# Patient Record
Sex: Female | Born: 1975 | Race: White | Hispanic: No | Marital: Married | Smoking: Never smoker
Health system: Southern US, Community
[De-identification: ages and names within clinical notes are randomized; demographics above are authoritative.]

## PROBLEM LIST (undated history)

## (undated) ENCOUNTER — Inpatient Hospital Stay (HOSPITAL_COMMUNITY): Payer: Self-pay

## (undated) DIAGNOSIS — D759 Disease of blood and blood-forming organs, unspecified: Secondary | ICD-10-CM

## (undated) DIAGNOSIS — E7800 Pure hypercholesterolemia, unspecified: Secondary | ICD-10-CM

## (undated) DIAGNOSIS — D682 Hereditary deficiency of other clotting factors: Secondary | ICD-10-CM

## (undated) DIAGNOSIS — N719 Inflammatory disease of uterus, unspecified: Secondary | ICD-10-CM

## (undated) DIAGNOSIS — T7840XA Allergy, unspecified, initial encounter: Secondary | ICD-10-CM

## (undated) DIAGNOSIS — D689 Coagulation defect, unspecified: Secondary | ICD-10-CM

## (undated) DIAGNOSIS — E785 Hyperlipidemia, unspecified: Secondary | ICD-10-CM

## (undated) DIAGNOSIS — E538 Deficiency of other specified B group vitamins: Secondary | ICD-10-CM

## (undated) DIAGNOSIS — D509 Iron deficiency anemia, unspecified: Secondary | ICD-10-CM

## (undated) DIAGNOSIS — Z8249 Family history of ischemic heart disease and other diseases of the circulatory system: Secondary | ICD-10-CM

## (undated) DIAGNOSIS — K5190 Ulcerative colitis, unspecified, without complications: Secondary | ICD-10-CM

## (undated) DIAGNOSIS — E559 Vitamin D deficiency, unspecified: Secondary | ICD-10-CM

## (undated) DIAGNOSIS — G4700 Insomnia, unspecified: Secondary | ICD-10-CM

## (undated) DIAGNOSIS — Z800 Family history of malignant neoplasm of digestive organs: Secondary | ICD-10-CM

## (undated) DIAGNOSIS — R002 Palpitations: Secondary | ICD-10-CM

## (undated) DIAGNOSIS — K219 Gastro-esophageal reflux disease without esophagitis: Secondary | ICD-10-CM

## (undated) HISTORY — DX: Inflammatory disease of uterus, unspecified: N71.9

## (undated) HISTORY — DX: Family history of ischemic heart disease and other diseases of the circulatory system: Z82.49

## (undated) HISTORY — DX: Insomnia, unspecified: G47.00

## (undated) HISTORY — DX: Gastro-esophageal reflux disease without esophagitis: K21.9

## (undated) HISTORY — DX: Vitamin D deficiency, unspecified: E55.9

## (undated) HISTORY — DX: Hyperlipidemia, unspecified: E78.5

## (undated) HISTORY — DX: Coagulation defect, unspecified: D68.9

## (undated) HISTORY — PX: COLONOSCOPY: SHX174

## (undated) HISTORY — DX: Deficiency of other specified B group vitamins: E53.8

## (undated) HISTORY — DX: Ulcerative colitis, unspecified, without complications: K51.90

## (undated) HISTORY — DX: Iron deficiency anemia, unspecified: D50.9

## (undated) HISTORY — DX: Allergy, unspecified, initial encounter: T78.40XA

## (undated) HISTORY — DX: Palpitations: R00.2

## (undated) HISTORY — PX: POLYPECTOMY: SHX149

## (undated) HISTORY — DX: Hereditary deficiency of other clotting factors: D68.2

## (undated) HISTORY — DX: Family history of malignant neoplasm of digestive organs: Z80.0

---

## 2003-06-08 DIAGNOSIS — G4700 Insomnia, unspecified: Secondary | ICD-10-CM | POA: Insufficient documentation

## 2006-12-04 ENCOUNTER — Emergency Department (HOSPITAL_COMMUNITY): Admission: EM | Admit: 2006-12-04 | Discharge: 2006-12-04 | Payer: Self-pay | Admitting: Family Medicine

## 2007-02-12 ENCOUNTER — Other Ambulatory Visit: Admission: RE | Admit: 2007-02-12 | Discharge: 2007-02-12 | Payer: Self-pay | Admitting: Family Medicine

## 2007-03-21 ENCOUNTER — Ambulatory Visit: Payer: Self-pay | Admitting: Hematology & Oncology

## 2008-02-29 ENCOUNTER — Encounter: Admission: RE | Admit: 2008-02-29 | Discharge: 2008-02-29 | Payer: Self-pay | Admitting: Family Medicine

## 2009-06-08 ENCOUNTER — Encounter: Admission: RE | Admit: 2009-06-08 | Discharge: 2009-06-08 | Payer: Self-pay | Admitting: Family Medicine

## 2010-06-06 HISTORY — PX: OVARIAN CYST REMOVAL: SHX89

## 2010-07-06 LAB — SURGICAL PCR SCREEN: MRSA, PCR: NEGATIVE

## 2010-07-06 LAB — CBC
MCH: 31.7 pg (ref 26.0–34.0)
MCV: 97.2 fL (ref 78.0–100.0)
Platelets: 281 10*3/uL (ref 150–400)
RBC: 4.29 MIL/uL (ref 3.87–5.11)

## 2010-07-07 ENCOUNTER — Ambulatory Visit (HOSPITAL_COMMUNITY)
Admission: RE | Admit: 2010-07-07 | Discharge: 2010-07-07 | Disposition: A | Discharge status: Home / Self Care | Payer: 59 | Attending: Obstetrics and Gynecology | Admitting: Obstetrics and Gynecology

## 2010-07-07 DIAGNOSIS — N83209 Unspecified ovarian cyst, unspecified side: Secondary | ICD-10-CM | POA: Insufficient documentation

## 2010-07-07 DIAGNOSIS — Z01818 Encounter for other preprocedural examination: Secondary | ICD-10-CM | POA: Insufficient documentation

## 2010-07-13 ENCOUNTER — Inpatient Hospital Stay (HOSPITAL_COMMUNITY)
Admission: RE | Admit: 2010-07-13 | Discharge: 2010-07-16 | DRG: 742 | Disposition: A | Discharge status: Home / Self Care | Payer: 59 | Source: Ambulatory Visit | Attending: Obstetrics and Gynecology | Admitting: Obstetrics and Gynecology

## 2010-07-13 ENCOUNTER — Other Ambulatory Visit: Payer: Self-pay | Admitting: Obstetrics and Gynecology

## 2010-07-13 DIAGNOSIS — N7013 Chronic salpingitis and oophoritis: Principal | ICD-10-CM | POA: Diagnosis present

## 2010-07-13 DIAGNOSIS — N831 Corpus luteum cyst of ovary, unspecified side: Secondary | ICD-10-CM | POA: Diagnosis present

## 2010-07-13 DIAGNOSIS — D62 Acute posthemorrhagic anemia: Secondary | ICD-10-CM | POA: Diagnosis present

## 2010-07-13 DIAGNOSIS — N838 Other noninflammatory disorders of ovary, fallopian tube and broad ligament: Secondary | ICD-10-CM | POA: Diagnosis present

## 2010-07-13 DIAGNOSIS — Z5331 Laparoscopic surgical procedure converted to open procedure: Secondary | ICD-10-CM

## 2010-07-13 DIAGNOSIS — N83209 Unspecified ovarian cyst, unspecified side: Secondary | ICD-10-CM | POA: Diagnosis present

## 2010-07-13 DIAGNOSIS — IMO0002 Reserved for concepts with insufficient information to code with codable children: Secondary | ICD-10-CM | POA: Diagnosis present

## 2010-07-14 LAB — CBC
MCHC: 31.7 g/dL (ref 30.0–36.0)
Platelets: 272 10*3/uL (ref 150–400)
RDW: 13 % (ref 11.5–15.5)
WBC: 13.6 10*3/uL — ABNORMAL HIGH (ref 4.0–10.5)

## 2010-07-15 LAB — CBC
HCT: 31.4 % — ABNORMAL LOW (ref 36.0–46.0)
MCH: 31.3 pg (ref 26.0–34.0)
MCV: 98.4 fL (ref 78.0–100.0)
Platelets: 265 10*3/uL (ref 150–400)
RDW: 12.8 % (ref 11.5–15.5)

## 2010-07-19 NOTE — H&P (Addendum)
NAME:  Anna Berger, Anna Berger                 ACCOUNT NO.:  [id number]  MEDICAL RECORD NO.:  [id number]          PATIENT TYPE:  AMB  LOCATION:  SDC                           FACILITY:  [facility]  PHYSICIAN:  Everett Graff, M.D.   DATE OF BIRTH:  02/19/1976  DATE OF ADMISSION: DATE OF DISCHARGE:  07/16/2010                             HISTORY & PHYSICAL   HISTORY OF PRESENT ILLNESS:  Anna Berger is a 35 year old married white female, para 1-0-0-1 with a longstanding history of infertility presenting for laparoscopic right ovarian cystectomy with the possibility of a laparoscopic salpingectomy and chromopertubation because of a right ovarian cyst and a hydrosalpinx.  For the past 2-1/2 years, the patient has been trying to conceive and has been given several rounds of Clomid - the last being July 2011.  In October 2011, when the patient had her annual examination, she was found to have an enlargement in her right adnexa that was mildly tender.  A pelvic ultrasound done showed a uterus measuring 8.16 x 6.80 x 4.16 cm along with a simple-appearing right ovarian cyst measuring 6.19 x 4.26 x 3.66 without any observed increased color Doppler flow.  The patient also was found to have a left hydrosalpinx measuring 4.72 x 4.25 x 1.77 cm with a left ovary-appearing normal.  At that time, the patient reported that she had been having ongoing right-sided pelvic pain for many years and that several times monthly, she would experience cramping (rated at a 5/10 on a 10-point pain scale) for several days.  She denies any urinary tract symptoms, changes in her bowel movements, vaginitis symptoms, nausea, vomiting or diarrhea.  The patient does have positional dyspareunia.  The patient's menstrual flow will last for 5 days, however, she only needs to change her pad once per day.  The patient has menstrual cramps that she rates as a 6/10 on a 10-point pain scale. She does find relief, however, by taking ibuprofen.  A  followup pelvic ultrasound was performed December 2011, that showed a persistent left hydrosalpinx along with a persistent right ovarian cyst measuring 6.66 x 4.74 x 3.87 cm.  Given that, the patient desires to conceive and has a history of infertility along with the persistent nature of both her ovarian cyst and hydrosalpinx, she has consented to proceed with surgical intervention for these findings.  PAST MEDICAL HISTORY:  OBSTETRICAL HISTORY:  Gravida 1, para 1-0-0-1.  The patient experienced a cesarean section in 2007.  GYNECOLOGIC HISTORY:  Menarche 35 years old.  Last menstrual period June 21, 2010.  She does not use any contraception, has no history of sexually transmitted diseases, has had a history of abnormal Pap smears for which she had a colposcopy in 2009, but her Pap smears have returned normal since that time most recent being October 2011.  MEDICAL HISTORY:  Ulcerative colitis, anemia, Factor V Leiden deficiency, hypercholesterolemia and blood clots.  SURGICAL HISTORY:  Negative.  The patient denies any history of blood transfusions.  FAMILY HISTORY:  Colon cancer, cardiovascular disease, Crohn disease and hypertension.  HABITS:  She does not use tobacco, illicit drugs.  She does  occasionally consume alcohol.  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  CURRENT MEDICATIONS: 1. Sulfasalazine 1 g daily. 2. Turmeric daily. 3. Probiotics daily. 4. Lipitor 40 mg at bedtime. 5. Trazodone 50 mg at bedtime. 6. Calcium with vitamin D daily. 7. Multivitamin daily.  ALLERGIES:  The patient has no known drug allergies.  Denies any sensitivities to latex, soy, shellfish or peanuts.  REVIEW OF SYSTEMS:  The patient denies any chest pain, shortness of breath, headache, vision changes, nasal congestion, difficulty swallowing, nausea, vomiting, diarrhea, heartburn, myalgias, arthralgias, skin rashes and except as mentioned in history of  present illness, the patient's review of systems is otherwise negative.  PHYSICAL EXAMINATION:  VITAL SIGNS:  Blood pressure is 102/62, pulse is 70, respirations 16, temperature 96 degrees Fahrenheit orally, weight 123 pounds, height 5 feet, three and one quater inches tall.  Body mass index 21. GENERAL:  Neck is supple without masses.  There is no thyromegaly or cervical adenopathy. HEART:  Regular rate and rhythm. LUNGS:  Clear. BACK:  No CVA tenderness. ABDOMEN:  No tenderness, guarding, rebound or organomegaly.  There are no palpable masses. EXTREMITIES:  No clubbing, cyanosis or edema. PELVIC:  EG/BUS are within normal limits.  Vagina is rugous.  Cervix are nontender without lesions.  Uterus appears normal size, shape and consistency without tenderness.  Adnexa reveals no masses.  However, the patient does have mild tenderness in her right adnexa.  IMPRESSION: 1. Persistent ovarian cyst. 2. Hydrosalpinx.  DISPOSITION:  A discussion was held with the patient regarding the indications for her procedures along with their risks which include, but are not limited to reaction to anesthesia, damage to adjacent organs, infection and excessive bleeding.  The patient further understands that there maybe a possibility that her ovary may need to be removed.  The patient has consented to proceed with a laparoscopic ovarian cystectomy with the possibility of a salpingectomy and chromopertubation at [facility] on July 13, 2010, at 10:30 a.m.     Anna Berger, P.A.-C   ______________________________ Everett Graff, M.D.   EJP/MEDQ  D:  07/08/2010  T:  07/09/2010  Job:  [id number]  Electronically Signed by Aris Georgia. on 07/27/2010 11:13:50 AM Electronically Signed by Everett Graff M.D. on 09/02/2010 09:48:51 AM

## 2010-07-23 NOTE — Discharge Summary (Signed)
Anna Berger, EKDAHL                 ACCOUNT NO.:  [id number]  MEDICAL RECORD NO.:  [id number]           PATIENT TYPE:  I  LOCATION:  9304                          FACILITY:  [facility]  PHYSICIAN:  Everett Graff, M.D.   DATE OF BIRTH:  [birth year]  DATE OF ADMISSION:  07/13/2010 DATE OF DISCHARGE:  07/16/2010                              DISCHARGE SUMMARY   DISCHARGE DIAGNOSES: 1. Persistent right ovarian cyst. 2. Left hydrosalpinx. 3. Dense pelvic adhesions. 4. Infertility.  OPERATION:  On the date of admission, the patient underwent laparoscopy followed by laparotomy, extensive lysis of adhesions, right ovarian cystectomy, and chromopertubation (revealing patent tubes).  HISTORY OF PRESENT ILLNESS:  Anna Berger is a 35 year old married white female para 1-0-0-1 with a longstanding history of infertility presenting for laparoscopic right ovarian cystectomy with the possibility of a laparoscopic salpingectomy and chromopertubation because of the wide ovarian cyst and hydrosalpinx.  Please see patient's dictated history and physical examination for details.  PREOPERATIVE PHYSICAL EXAMINATION:  VITAL SIGNS:  Blood pressure 102/62, pulse 70, respirations 16, temperature 96 degrees Fahrenheit orally, weight 123 pounds, height 5 feet 3-1/4 inches tall, body mass index was 21. GENERAL:  Within normal limits. PELVIC:  EG/BUS were within normal limits.  Vagina was rugose.  Cervix was nontender without lesions.  Uterus appeared normal in size, shape, and consistency without tenderness.  Adnexa did not reveal any masses, however, patient did have mild tenderness in her right adnexa.  HOSPITAL COURSE:  On the date of admission, the patient underwent aforementioned procedures tolerating them all well.  The patient's postoperative course was marked by lower abdominal incisional hematoma that was evacuated on postop day #2, and the wound packed with sterile plain gauze.  It was believed that this  was initiated by the institution of Lovenox postoperatively.  Once Lovenox was discontinued, the patient gradually had a slowing of her incisional hematoma formation and by hospital day #4, she was ready for discharge home.  The patient's discharge hemoglobin and hematocrit were 10.0 and 31.4 respectively with her preop hemoglobin having been 13.6 and hematocrit 41.7 respectively.  DISCHARGE MEDICATIONS:  The patient was directed to her home medication reconciliation form.  She was further prescribed: 1. Percocet 5/325 one to two tablets every 4-6 hours as needed for     pain. 2. Ibuprofen 600 mg with food every 6 hours for 3 days then as needed     for pain. 3. Colace 100 mg twice daily until her bowel movements are regular.  FOLLOWUP:  The patient is scheduled for followup visit with Dr. Mancel Bale on July 23, 2010, at 11:15 a.m.  She also has a postoperative visit scheduled for August 23, 2010, at 2 o'clock p.m.  DISCHARGE INSTRUCTIONS:  The patient was given a copy of [facility] postoperative instruction sheet.  She was further advised to avoid driving for 2 weeks, intercourse for 6 weeks, lifting for 6 weeks, that she may shower, she may walk up steps.  The patient's diet was without restriction.  WOUND CARE:  The patient was advised to keep her incision clean, dry, and utilize  warm compresses or heating pad over her clothing in the area of her lower abdominal incision four times daily.  FINAL PATHOLOGY:  Peritoneal washings, reactive mesothelial cells present.  The pathology regarding the patient's right ovarian cyst and hydrosalpinx were not available at the time of this dictation.     Elmira J. Florene Glen, P.A.-C   ______________________________ Everett Graff, M.D.    EJP/MEDQ  D:  07/19/2010  T:  07/20/2010  Job:  [id number]  Electronically Signed by Aris Georgia. on 07/21/2010 09:55:02 PM Electronically Signed by Everett Graff M.D. on 07/23/2010  09:32:32 AM

## 2010-09-09 NOTE — Op Note (Signed)
[name], Anna Berger                 ACCOUNT NO.:  [id number]  MEDICAL RECORD NO.:  [id number]           PATIENT TYPE:  LOCATION:                                 FACILITY:  PHYSICIAN:  Everett Graff, M.D.   DATE OF BIRTH:  1976/02/15  DATE OF PROCEDURE:  07/13/2010 DATE OF DISCHARGE:                              OPERATIVE REPORT   PREOPERATIVE DIAGNOSES: 1. Persistent ovarian cyst. 2. Hydrosalpinx.  POSTOPERATIVE DIAGNOSES: 1. Persistent ovarian cyst. 2. Hydrosalpinx. 3. Dense pelvic adhesions.  PROCEDURES: 1. Diagnostic laparoscopy. 2. Laparotomy with lysis of adhesions. 3. Bilateral paratubal cystectomy. 4. Chromopertubation.  ATTENDING:  Dr. Everett Graff.  ASSISTANT:  Elmira J. Florene Glen, PA-C  ANESTHESIA:  General.  SPECIMENS TO PATHOLOGY:  Bilateral paratubal versus ovarian cyst.  FLUIDS:  1600 mL.  URINE OUTPUT:  300 mL.  ESTIMATED BLOOD LOSS:  Left than 100 mL.  COMPLICATIONS:  None.  PROCEDURE IN DETAILS:  The patient was taken to the operating room after the risks, benefits and alternatives discussed with the patient.  The patient verbalized understanding and consent signed and witnessed.  The patient was placed under general anesthesia and prepped and draped in normal sterile fashion in the dorsal lithotomy position.  The intrauterine manipulator was placed using the acorn tenaculum in order to do chromopertubation as well.  This was done after placing a bivalve speculum in the patient's vagina and grasping the anterior lip of the cervix with single-tooth tenaculum.  Once the acorn tenaculum was placed, all instruments were removed from below and after gowning and re- gloving, attention was then turned to the abdomen where a 10-mm incision was made at the umbilicus.  The incision was taken down to the level of the peritoneum and the peritoneum entered and the Hasson placed after a pursestring stitch of 0 Vicryl was placed as well.  The laparoscope  was introduced and several dense adhesions were noted to the intraabdominal wall making difficult to perform laparoscopy.  The decision was made to perform laparotomy in order to finish the remainder of the procedure.  A Pfannenstiel skin incision was made at the site of prior C-section scar and carried down to the underlying layer of fascia with the scalpel and the Bovie.  This was done after tying the purse-string stitch that was placed on the fascia at the umbilicus and repairing the skin incision with 3-0 Monocryl via subcuticular stitch.  Kocher clamps were placed on the inferior aspect of the fascial incision and the rectus muscle excised from the fascia.  The same was done on the superior aspect of the fascial incision.  The muscle was separated in the midline and the peritoneum entered bluntly and extended manually and sharply with the Metzenbaum scissors.  The O'Connor-O'Sullivan was placed for self- retaining retraction and laparotomy sponges placed as well.  Bilateral paratubal versus ovarian cysts were noted.  The fallopian tubes bilaterally, however, appeared to be within normal limits.  All cysts were removed after being dissected away.  They were sent to pathology. Chromopertubation was performed and bilateral fallopian tube has been noted to be patent.  The ovaries  appeared to be within normal limits as well as the uterus.  Copious irrigation was performed and all instruments were removed.  The peritoneum was repaired with 2-0 chromic via running stitch.  The fascia was repaired with 0 Vicryl via running stitch.  The skin was reapproximated using 3-0 Monocryl via subcuticular stitch.  Sponge, lap and needle count was correct.  The patient tolerated the procedure well and was awaiting return to the recovery room in good condition.     Everett Graff, M.D.     AR/MEDQ  D:  09/08/2010  T:  09/09/2010  Job:  [id number]  Electronically Signed by Everett Graff M.D. on  09/09/2010 10:07:22 AM

## 2011-08-04 ENCOUNTER — Encounter: Payer: Self-pay | Admitting: Gastroenterology

## 2011-08-15 ENCOUNTER — Encounter: Payer: Self-pay | Admitting: *Deleted

## 2011-08-16 ENCOUNTER — Other Ambulatory Visit (INDEPENDENT_AMBULATORY_CARE_PROVIDER_SITE_OTHER): Payer: 59

## 2011-08-16 ENCOUNTER — Ambulatory Visit (INDEPENDENT_AMBULATORY_CARE_PROVIDER_SITE_OTHER): Payer: 59 | Admitting: Gastroenterology

## 2011-08-16 ENCOUNTER — Encounter: Payer: Self-pay | Admitting: Gastroenterology

## 2011-08-16 VITALS — BP 98/74 | HR 112 | Ht 63.0 in | Wt 115.4 lb

## 2011-08-16 DIAGNOSIS — N83209 Unspecified ovarian cyst, unspecified side: Secondary | ICD-10-CM

## 2011-08-16 DIAGNOSIS — K5190 Ulcerative colitis, unspecified, without complications: Secondary | ICD-10-CM | POA: Insufficient documentation

## 2011-08-16 DIAGNOSIS — N83202 Unspecified ovarian cyst, left side: Secondary | ICD-10-CM

## 2011-08-16 DIAGNOSIS — D6851 Activated protein C resistance: Secondary | ICD-10-CM | POA: Insufficient documentation

## 2011-08-16 DIAGNOSIS — Z800 Family history of malignant neoplasm of digestive organs: Secondary | ICD-10-CM

## 2011-08-16 DIAGNOSIS — D6859 Other primary thrombophilia: Secondary | ICD-10-CM

## 2011-08-16 DIAGNOSIS — M0760 Enteropathic arthropathies, unspecified site: Secondary | ICD-10-CM

## 2011-08-16 DIAGNOSIS — K51919 Ulcerative colitis, unspecified with unspecified complications: Secondary | ICD-10-CM | POA: Insufficient documentation

## 2011-08-16 DIAGNOSIS — E739 Lactose intolerance, unspecified: Secondary | ICD-10-CM

## 2011-08-16 DIAGNOSIS — IMO0002 Reserved for concepts with insufficient information to code with codable children: Secondary | ICD-10-CM | POA: Insufficient documentation

## 2011-08-16 DIAGNOSIS — D500 Iron deficiency anemia secondary to blood loss (chronic): Secondary | ICD-10-CM

## 2011-08-16 DIAGNOSIS — K639 Disease of intestine, unspecified: Secondary | ICD-10-CM | POA: Insufficient documentation

## 2011-08-16 DIAGNOSIS — N83201 Unspecified ovarian cyst, right side: Secondary | ICD-10-CM | POA: Insufficient documentation

## 2011-08-16 LAB — BASIC METABOLIC PANEL
BUN: 14 mg/dL (ref 6–23)
GFR: 66.46 mL/min (ref >60.00)
Glucose, Bld: 84 mg/dL (ref 70–99)
Sodium: 136 mEq/L (ref 135–145)

## 2011-08-16 LAB — CBC WITH DIFFERENTIAL/PLATELET
Basophils Relative: 1.1 % (ref 0.0–3.0)
Eosinophils Absolute: 0.6 10*3/uL (ref 0.0–0.7)
HCT: 40.4 % (ref 36.0–46.0)
Hemoglobin: 13.3 g/dL (ref 12.0–15.0)
Lymphocytes Relative: 16 % (ref 12.0–46.0)
Lymphs Abs: 1.5 10*3/uL (ref 0.7–4.0)
MCHC: 33.1 g/dL (ref 30.0–36.0)
Neutrophils Relative %: 70.3 % (ref 43.0–77.0)
Platelets: 348 10*3/uL (ref 150.0–400.0)
RBC: 4.12 Mil/uL (ref 3.87–5.11)

## 2011-08-16 LAB — HEPATIC FUNCTION PANEL
ALT: 16 U/L (ref 0–35)
AST: 25 U/L (ref 0–37)
Bilirubin, Direct: 0.1 mg/dL (ref 0.0–0.3)

## 2011-08-16 LAB — TSH: TSH: 1.52 u[IU]/mL (ref 0.35–5.50)

## 2011-08-16 LAB — FOLATE: Folate: 8.8 ng/mL (ref >5.9)

## 2011-08-16 LAB — VITAMIN B12: Vitamin B-12: 243 pg/mL (ref 211–911)

## 2011-08-16 LAB — SEDIMENTATION RATE: Sed Rate: 25 mm/hr — ABNORMAL HIGH (ref 0–22)

## 2011-08-16 LAB — HIGH SENSITIVITY CRP: CRP, High Sensitivity: 1 mg/L (ref 0.000–5.000)

## 2011-08-16 MED ORDER — CELECOXIB 200 MG PO CAPS: 200.0000 mg | ORAL_CAPSULE | Freq: Every day | ORAL | Status: DC

## 2011-08-16 MED ORDER — MESALAMINE 1.2 G PO TBEC: DELAYED_RELEASE_TABLET | ORAL | Status: DC

## 2011-08-16 NOTE — Patient Instructions (Signed)
Stop your Sulfasalazine.  Start Lialda two tablet by mouth every morning, Your prescription has been sent to you pharmacy.  Take Celebrex as needed once a day, Your prescription has been sent to you pharmacy.  Please go to the basement today for your labs.  Make a appointment to come back and see Dr Sharlett Iles in 2 months.  You were given a FODMAP diet to follow today.  Information given on the [employer].

## 2011-08-16 NOTE — Progress Notes (Signed)
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 Since age 36, this patient has carried a diagnosis of ulcerative colitis, and is submitted confirmed on numerous colonoscopies performed in [facility] with last procedure in June of 2012 colonoscopy showing diffuse mild erythema and granularity. Multiple biopsies showed no evidence of dysplasia. At that time she was not on aminosialyicate therapy, but subsequently  restarted Azulfidine 1 g a day. She currently will have 4-5 loose nonbloody bowel movements a day with some gas and bloating, occasional acid reflux symptoms controlled with when necessary H2 blockers, and she does have lactose intolerance. Patient has been on prednisone in the past, but has not been on immunosuppressants or biological agents. She relates that she currently feels well and has no systemic complaints except for arthralgias in her knees and other large joints. She does use fairly often ibuprofen.  She currently denies abdominal pain except for occasional right lower quadrant pain, no current rectal bleeding. Her appetite is good her weight is stable. Her family history is remarkable for colon cancer in uncle, also a relative with Crohn's disease.Murel had surgery for multiple ovarian cyst in the last 2-3 years, and apparently was diagnosed with factor V Leiden mutation, has been seen by oncology, and she will need Coumadin if she becomes pregnant. She currently is trying to become pregnant, but has had difficulty achieving such. She was on Depo for several years. The patient also describes periodic aphthous ulcerations in her mouth, no skin rashes, and no hepatobiliary symptoms with a previous negative gallbladder ultrasound. She has a chronic anemia, but cannot tolerate iron preparations. She has not been on recent antibiotics, and gives no history of C.  difficile infections. Patient does take probiotic therapy which apparently has helped her symptomatology subjectively.  I have reviewed this patient's present history, medical and surgical past history, allergies and medications.     ROS: The remainder of the 10 point ROS is negative  No Known Allergies Outpatient Prescriptions Prior to Visit  Medication Sig Dispense Refill  . atorvastatin (LIPITOR) 40 MG tablet Take 20 mg by mouth daily.       . cetirizine (ZYRTEC) 10 MG tablet Take 10 mg by mouth as needed.       . Misc Natural Products (TURMERIC CURCUMIN) CAPS Take 1 capsule by mouth at bedtime.      Marland Kitchen PROBIOTIC CAPS Take 1 capsule by mouth daily. 20 billion      . traZODone (DESYREL) 100 MG tablet Take 50-100 mg by mouth at bedtime.      . Calcium Carbonate-Vitamin D (CALCIUM + D) 600-200 MG-UNIT TABS Take 1 tablet by mouth 2 (two) times daily.      . Prenatal Vit-Fe Fumarate-FA (MULTIVITAMIN-PRENATAL) 27-0.8 MG TABS Take 1 tablet by mouth daily.       Past Medical History  Diagnosis Date  . UC (ulcerative colitis)   . Hyperlipemia   . Endometritis   . Iron deficiency anemia   . Factor V deficiency   . Unspecified vitamin D deficiency   . Insomnia, unspecified   . Family history of malignant neoplasm of gastrointestinal tract    Past Surgical History  Procedure Date  . Cesarean section 02/2006  . Ovarian cyst removal 2012   History   Social History  . Marital Status: Married    Spouse Name: N/A    Number of Children: 1  .  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 Smoking status: Never Smoker   . Smokeless tobacco: Never Used  . Alcohol Use: Yes     social  . Drug Use: No  . Sexually Active: None   Other Topics Concern  . None   Social History Narrative  . None   Family History  Problem Relation Age of Onset  . Hypertension Father   . Coronary artery disease Father   . Heart attack Father  58  . Hyperlipidemia Father   . Hyperlipidemia Brother   . Crohn's disease Maternal Uncle   . Colon cancer Maternal Uncle   . Colon polyps Paternal Aunt   . Diabetes Maternal Uncle         Physical Exam: General well developed well nourished patient in no acute distress, appearing her stated age Eyes PERRLA, no icterus, fundoscopic exam per opthamologist Skin no lesions noted Neck supple, no adenopathy, no thyroid enlargement, no tenderness Chest clear to percussion and auscultation Heart no significant murmurs, gallops or rubs noted Abdomen no hepatosplenomegaly masses or tenderness, BS normal. Minimal tenderness bilaterally in the lower quadrant areas. Extremities no acute joint lesions, edema, phlebitis or evidence of cellulitis. Neurologic patient oriented x 3, cranial nerves intact, no focal neurologic deficits noted. Psychological mental status normal and normal affect.  Assessment and plan: Chronic ulcerative colitis of over 20 years duration in a patient who is currently partial remission on low dose aminosialyicate therapy. She is due for repeat colonoscopy with dysplasia screening every 2 years. I ordered labs today including CRP and sed rate, have changed her from Azulfidine to Lialda 2.4 g a day with twice a day usage if needed,FODMAP diet avoidance of nonabsorbable dietary carbohydrates, have urged her use when necessary Lactaid tablets for lactose deficiency, she is to avoid Cox 1 anti-inflammatories and to use when necessary Celebrex 200 mg a day. We have given her information concerning the national [employer] as a source of reliable information concerning IBD its management, medications, etc. she is to see me back in 2 months time for followup or sooner if her disease worsens. I spoke with her today about pregnancy, it would be of use for her to be in remission before she becomes pregnant. Apparently, gynecologic evaluation is currently in process. The patient  does not abuse alcohol or cigarettes. She appears healthy and well-nourished.  Encounter Diagnosis  Name Primary?  . UC (ulcerative colitis) Yes

## 2011-08-17 ENCOUNTER — Other Ambulatory Visit: Payer: Self-pay | Admitting: Gastroenterology

## 2011-08-17 LAB — CELIAC PANEL 10
Endomysial Screen: NEGATIVE
IgA: 331 mg/dL (ref 69–380)
Tissue Transglutaminase Ab, IgA: 3.9 U/mL (ref <20)

## 2011-08-17 MED ORDER — CYANOCOBALAMIN 1000 MCG/ML IJ SOLN: INTRAMUSCULAR | Status: DC

## 2011-09-12 ENCOUNTER — Encounter (INDEPENDENT_AMBULATORY_CARE_PROVIDER_SITE_OTHER): Payer: 59

## 2011-09-12 ENCOUNTER — Encounter (INDEPENDENT_AMBULATORY_CARE_PROVIDER_SITE_OTHER): Payer: 59 | Admitting: Obstetrics and Gynecology

## 2011-09-12 DIAGNOSIS — N979 Female infertility, unspecified: Secondary | ICD-10-CM

## 2011-10-12 ENCOUNTER — Encounter: Payer: Self-pay | Admitting: *Deleted

## 2011-10-18 ENCOUNTER — Ambulatory Visit (INDEPENDENT_AMBULATORY_CARE_PROVIDER_SITE_OTHER): Payer: 59 | Admitting: Gastroenterology

## 2011-10-18 ENCOUNTER — Encounter: Payer: Self-pay | Admitting: Gastroenterology

## 2011-10-18 VITALS — BP 118/72 | HR 80 | Ht 63.0 in | Wt 117.0 lb

## 2011-10-18 DIAGNOSIS — K5190 Ulcerative colitis, unspecified, without complications: Secondary | ICD-10-CM

## 2011-10-18 NOTE — Progress Notes (Signed)
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 She is currently on every two-year dysplasia colonoscopy screening protocol, and is due for followup exam in June of next year. Lab data has shown evidence of B12 deficiency, and she currently is on parenteral B12 replacement therapy, and has noticed increased strength and sense of well-being. She denies diarrhea, rectal bleeding, abdominal pain, anorexia, weight loss, upper GI or hepatobiliary complaints. She only tolerate Lialda 1.2 g a day. Large doses of aminosalicylates have caused  severe nausea.  Current Medications, Allergies, Past Medical History, Past Surgical History, Family History and Social History were reviewed in [employer] record.  Pertinent Review of Systems Negative   Physical Exam: Healthy-appearing patient in no distress. Blood pressure 118/72, pulse 80 and regular, weight 117 pounds, and BMI 20.73. I cannot appreciate stigmata of chronic liver disease. Her abdomen shows no organomegaly, distention, masses or tenderness. Bowel sounds are normal.    Assessment and Plan: Chronic ulcerative colitis currently doing well on Lialda 1.2 g a day. She uses when necessary Celebrex for arthritis, monthly B12 injections, and probiotic therapy. We will schedule her for followup colonoscopy in dysplasia screening next June. Have not changed her medications otherwise, we'll see her on when necessary basis as needed. She has previously been on prednisone but not on immunosuppressants or biological agents. No diagnosis found.

## 2011-10-18 NOTE — Patient Instructions (Signed)
Continue Lialda one tablet by mouth once a day.

## 2011-11-16 ENCOUNTER — Telehealth: Payer: Self-pay

## 2011-11-16 NOTE — Telephone Encounter (Signed)
Pt states she never filled the script for Femara written on 09/12/2011 because she took it to another pharmacy and it was way too expensive. So she took a 2 cycle break. Just wanted me to let AR know that she never did 09/2011's cycle of Femara. I will call pt in am to set up Follicle study on day 10. Today is her day one. Carloyn Manner A

## 2011-11-16 NOTE — Telephone Encounter (Signed)
Called RF in of Femara 2.5 mg sig 2 tabs PO qd x 5 days, starting on day #3. # 10 tabs. ) RF at this time. To [facility], per AR. Carloyn Manner A

## 2011-11-17 ENCOUNTER — Other Ambulatory Visit: Payer: Self-pay

## 2011-11-17 ENCOUNTER — Telehealth: Payer: Self-pay

## 2011-11-17 DIAGNOSIS — N970 Female infertility associated with anovulation: Secondary | ICD-10-CM

## 2011-11-17 NOTE — Telephone Encounter (Signed)
Called pt to get her scheduled for follicle study on Day 10 per AR. It is ordered and sched for the 21st. June. If cyst is gone or stable and pt has dominant follicle, / do HCG. Notify Dr. On call. Carloyn Manner A

## 2011-11-25 ENCOUNTER — Other Ambulatory Visit: Payer: Self-pay | Admitting: Obstetrics and Gynecology

## 2011-11-25 ENCOUNTER — Other Ambulatory Visit: Payer: 59

## 2011-11-25 ENCOUNTER — Ambulatory Visit (INDEPENDENT_AMBULATORY_CARE_PROVIDER_SITE_OTHER): Payer: 59

## 2011-11-25 DIAGNOSIS — N970 Female infertility associated with anovulation: Secondary | ICD-10-CM

## 2011-11-25 MED ORDER — CHORIOGONADOTROPIN ALFA 250 MCG/0.5ML ~~LOC~~ INJ
250.0000 ug | INJECTION | Freq: Once | SUBCUTANEOUS | Status: AC
  Administered 2011-11-25: 0.25 mg via SUBCUTANEOUS

## 2011-11-25 NOTE — Progress Notes (Signed)
Pt presented for U/S.  Per Dr Brent General showed dominant follicle of 0.93JP  Previous ovarian cyst resolved.   To proceed with HCG.   Given without difficulty.  No reaction noted after 15 min.  Pt to have timed IC.   Will call with menses or +UPT.

## 2011-12-09 ENCOUNTER — Telehealth: Payer: Self-pay

## 2011-12-09 NOTE — Telephone Encounter (Signed)
Per AR. I called in another RF of Femara 2.5 sig take 2 daily days 3-7 #10 . Pt will be set up for follicle study on the 23FT at [facility].Denice Paradise CMA

## 2011-12-13 ENCOUNTER — Other Ambulatory Visit: Payer: Self-pay | Admitting: Obstetrics and Gynecology

## 2011-12-13 ENCOUNTER — Telehealth: Payer: Self-pay

## 2011-12-13 ENCOUNTER — Other Ambulatory Visit: Payer: Self-pay

## 2011-12-13 DIAGNOSIS — N979 Female infertility, unspecified: Secondary | ICD-10-CM

## 2011-12-13 NOTE — Telephone Encounter (Signed)
Called Anna Berger to let her know I sched her Follicle study for 09/40/76 @ [facility] ( Sat.) Order is in [place].  Audery Amel was notified to call [facility] on Call or Dr. On call that day to review for need for HCG.

## 2011-12-17 ENCOUNTER — Ambulatory Visit (HOSPITAL_COMMUNITY)
Admission: RE | Admit: 2011-12-17 | Discharge: 2011-12-17 | Disposition: A | Discharge status: Home / Self Care | Payer: 59 | Source: Ambulatory Visit | Attending: Obstetrics and Gynecology | Admitting: Obstetrics and Gynecology

## 2011-12-17 ENCOUNTER — Inpatient Hospital Stay (HOSPITAL_COMMUNITY)
Admission: AD | Admit: 2011-12-17 | Discharge: 2011-12-17 | Disposition: A | Discharge status: Home / Self Care | Payer: 59 | Source: Ambulatory Visit | Attending: Obstetrics and Gynecology | Admitting: Obstetrics and Gynecology

## 2011-12-17 DIAGNOSIS — N838 Other noninflammatory disorders of ovary, fallopian tube and broad ligament: Secondary | ICD-10-CM | POA: Insufficient documentation

## 2011-12-17 DIAGNOSIS — N979 Female infertility, unspecified: Secondary | ICD-10-CM | POA: Insufficient documentation

## 2011-12-17 NOTE — MAU Note (Deleted)
Administered 10,000 units in 2.5 ml injections x 4, 2 to left out quadrant, and 2 to right  Outer quadrant, patient brought med from [facility] as unable at [facility].

## 2011-12-17 NOTE — MAU Note (Signed)
Administered 10,000 units of HCG as ordered left outer quadrant.

## 2011-12-20 ENCOUNTER — Telehealth: Payer: Self-pay

## 2011-12-20 NOTE — Telephone Encounter (Signed)
Spoke to McCoy at [place] re: retro prior auth for Novadrel that pt had to pay out of pocket for on 12/17/2011 when she had the follicle study at [facility], and she was given the inj. After picking up med at [facility]. Freda Munro will process a back dated request for prior auth for the 07/13 date of service. And she was able to give future prior auth for this if needed in the future. The Auth # is [id number] and expires in one year. If the retro Josem Kaufmann is given, [facility] will need to reprocess the claim and figure out how to re-imburse the pt. Carloyn Manner A

## 2012-02-27 ENCOUNTER — Telehealth: Payer: Self-pay

## 2012-02-27 ENCOUNTER — Other Ambulatory Visit: Payer: Self-pay

## 2012-02-27 DIAGNOSIS — N970 Female infertility associated with anovulation: Secondary | ICD-10-CM

## 2012-02-27 NOTE — Telephone Encounter (Signed)
Spoke to pt to let her know that we'll refill her Femara 2.5 mg sig: take 2 tabs qd days 3-7 of cycle. #10  O RF's. To [facility]. Pt was sched for consult w/ Dr Mancel Bale on the 30th to discuss plan. Pt may have day 10 follicle study after consult if they decide to go that route. Dr. Mancel Bale was made aware of this. Carloyn Manner A

## 2012-02-27 NOTE — Telephone Encounter (Signed)
Spoke to pt who called stating that her and her husband had taken a little break in trying to conceive, but they are ready to start trying again. Wants to know what Dr. Mancel Bale wants her to do next. Today is day 3 so I told her I would get an answer for her asap and get back with her so that if she needs to start on meds today they will be available at her pharmacy. [facility]. Carloyn Manner A

## 2012-03-05 ENCOUNTER — Encounter: Payer: 59 | Admitting: Obstetrics and Gynecology

## 2012-03-05 ENCOUNTER — Other Ambulatory Visit: Payer: Self-pay | Admitting: Obstetrics and Gynecology

## 2012-03-05 DIAGNOSIS — N979 Female infertility, unspecified: Secondary | ICD-10-CM

## 2012-03-13 ENCOUNTER — Ambulatory Visit (INDEPENDENT_AMBULATORY_CARE_PROVIDER_SITE_OTHER): Payer: 59 | Admitting: Obstetrics and Gynecology

## 2012-03-13 ENCOUNTER — Encounter: Payer: Self-pay | Admitting: Obstetrics and Gynecology

## 2012-03-13 VITALS — BP 122/68 | Resp 16 | Ht 63.0 in | Wt 118.0 lb

## 2012-03-13 DIAGNOSIS — N970 Female infertility associated with anovulation: Secondary | ICD-10-CM

## 2012-03-13 MED ORDER — LETROZOLE 2.5 MG PO TABS: 5.0000 mg | ORAL_TABLET | Freq: Every day | ORAL | Status: DC

## 2012-03-13 NOTE — Progress Notes (Signed)
Here to discuss next step with infertility.  She and her husband are serious about proceeding.  Filed Vitals:   03/13/12 1613  BP: 122/68  Resp: 16   A/P Pt says she can no longer get any more Rxs for femara unless she pays out of pocket if it is not written through [facility] and she is given a 90 day supply.  She is a PA and understands instructions quite well so I will comply with this request. If however, she does not conceive after 3 cycles of Femara 50m with trigger injection of HCG or ovidrel and IUI, then [facility] I suggested that now but she says we have not done the IUI so she would like to try that first and she says she and her husband are serious. Rx sent Pt will call to have day 10 follicle study, with trigger inj once follicle is 2cm and then IUI. NIzora Galahas been sent this chart to make sure her husband understands his part in the IUI Pt knows to start the medicine on day 3 for 5days Questions answered

## 2012-03-14 ENCOUNTER — Telehealth: Payer: Self-pay | Admitting: Obstetrics and Gynecology

## 2012-03-14 NOTE — Telephone Encounter (Signed)
Message copied by Tania Ade on Wed Mar 14, 2012  4:46 PM ------      Message from: Everett Graff      Created: Tue Mar 13, 2012  5:48 PM       Please make sure this pt's husband gets info for sperm wash in preparation for IUI.  Thank you

## 2012-03-14 NOTE — Telephone Encounter (Signed)
Tc TO pt. Explained sperm washing and IUI procedures including fees.  Pt verbalizes comprehension.

## 2012-03-19 ENCOUNTER — Telehealth: Payer: Self-pay

## 2012-03-20 ENCOUNTER — Telehealth: Payer: Self-pay

## 2012-03-20 NOTE — Telephone Encounter (Signed)
LM for pt to cb about getting AEX scheduled after 05/12/2012. Anna Berger A

## 2012-03-23 ENCOUNTER — Other Ambulatory Visit: Payer: Self-pay

## 2012-03-23 DIAGNOSIS — N979 Female infertility, unspecified: Secondary | ICD-10-CM

## 2012-03-23 DIAGNOSIS — N970 Female infertility associated with anovulation: Secondary | ICD-10-CM

## 2012-03-26 ENCOUNTER — Ambulatory Visit (INDEPENDENT_AMBULATORY_CARE_PROVIDER_SITE_OTHER): Payer: 59

## 2012-03-26 ENCOUNTER — Telehealth: Payer: Self-pay

## 2012-03-26 DIAGNOSIS — N979 Female infertility, unspecified: Secondary | ICD-10-CM

## 2012-03-26 NOTE — Telephone Encounter (Signed)
Spoke to pt to let her know that Dr. Mancel Bale wants her to have repeat follicle study on Wed this week. Appt scheduled for 9 am the 23rd. Carloyn Manner A

## 2012-03-28 ENCOUNTER — Other Ambulatory Visit (INDEPENDENT_AMBULATORY_CARE_PROVIDER_SITE_OTHER): Payer: 59

## 2012-03-28 ENCOUNTER — Other Ambulatory Visit: Payer: Self-pay | Admitting: Radiology

## 2012-03-28 ENCOUNTER — Encounter: Payer: Self-pay | Admitting: Obstetrics and Gynecology

## 2012-03-28 ENCOUNTER — Encounter (INDEPENDENT_AMBULATORY_CARE_PROVIDER_SITE_OTHER): Payer: 59

## 2012-03-28 ENCOUNTER — Other Ambulatory Visit: Payer: Self-pay

## 2012-03-28 DIAGNOSIS — N970 Female infertility associated with anovulation: Secondary | ICD-10-CM

## 2012-03-28 DIAGNOSIS — N939 Abnormal uterine and vaginal bleeding, unspecified: Secondary | ICD-10-CM

## 2012-03-28 DIAGNOSIS — N979 Female infertility, unspecified: Secondary | ICD-10-CM

## 2012-03-28 MED ORDER — CHORIOGONADOTROPIN ALFA 250 MCG/0.5ML ~~LOC~~ INJ
250.0000 ug | INJECTION | Freq: Once | SUBCUTANEOUS | Status: AC
  Administered 2012-03-28: 0.25 mg via SUBCUTANEOUS

## 2012-03-28 NOTE — Progress Notes (Signed)
Per Dr Gillermo Murdoch, pt's husband scheduled for sperm washing 03/29/12 at 1:00 at [facility], [place], [place]. Sched for IUI 03/29/12 at 2"00 with Dr Gillermo Murdoch. Pt aware payment needed prior to IUI for sperm washing.

## 2012-03-28 NOTE — Progress Notes (Unsigned)
Ovidrel given per AR SQ without difficulty.  Pt waited 20 minutes after, no reaction.

## 2012-03-29 ENCOUNTER — Encounter: Payer: Self-pay | Admitting: Obstetrics and Gynecology

## 2012-03-29 ENCOUNTER — Ambulatory Visit (INDEPENDENT_AMBULATORY_CARE_PROVIDER_SITE_OTHER): Payer: 59 | Admitting: Obstetrics and Gynecology

## 2012-03-29 VITALS — BP 100/64 | Ht 63.0 in | Wt 117.0 lb

## 2012-03-29 DIAGNOSIS — Z139 Encounter for screening, unspecified: Secondary | ICD-10-CM

## 2012-03-29 LAB — POCT URINE PREGNANCY: Preg Test, Ur: NEGATIVE

## 2012-03-29 NOTE — Progress Notes (Signed)
Here for 1st IUI Sperm wash reviewed IUI performed without difficulty Pt will call if gets cycle and check upt and take another round of femara 42m (pt has enough) if UPT neg If cycle is missed, pt will take upt and notify u[place]if pos. AEX due in Dec

## 2012-04-13 ENCOUNTER — Telehealth: Payer: Self-pay

## 2012-04-13 NOTE — Telephone Encounter (Signed)
Appt sched for follicle study for Day 12 04/23/2012 per AR. Denice Paradise

## 2012-04-19 ENCOUNTER — Other Ambulatory Visit: Payer: Self-pay

## 2012-04-19 DIAGNOSIS — N979 Female infertility, unspecified: Secondary | ICD-10-CM

## 2012-04-19 NOTE — Telephone Encounter (Signed)
Note to close encounter. Carloyn Manner A

## 2012-04-23 ENCOUNTER — Ambulatory Visit (INDEPENDENT_AMBULATORY_CARE_PROVIDER_SITE_OTHER): Payer: 59

## 2012-04-23 ENCOUNTER — Other Ambulatory Visit: Payer: Self-pay

## 2012-04-23 DIAGNOSIS — N979 Female infertility, unspecified: Secondary | ICD-10-CM

## 2012-04-24 ENCOUNTER — Other Ambulatory Visit: Payer: Self-pay | Admitting: Obstetrics and Gynecology

## 2012-04-24 ENCOUNTER — Ambulatory Visit (INDEPENDENT_AMBULATORY_CARE_PROVIDER_SITE_OTHER): Payer: 59

## 2012-04-24 DIAGNOSIS — N970 Female infertility associated with anovulation: Secondary | ICD-10-CM

## 2012-04-24 DIAGNOSIS — N979 Female infertility, unspecified: Secondary | ICD-10-CM

## 2012-04-25 ENCOUNTER — Ambulatory Visit (INDEPENDENT_AMBULATORY_CARE_PROVIDER_SITE_OTHER): Payer: 59

## 2012-04-25 DIAGNOSIS — N970 Female infertility associated with anovulation: Secondary | ICD-10-CM

## 2012-04-26 ENCOUNTER — Other Ambulatory Visit: Payer: Self-pay

## 2012-04-26 ENCOUNTER — Ambulatory Visit (INDEPENDENT_AMBULATORY_CARE_PROVIDER_SITE_OTHER): Payer: 59

## 2012-04-26 DIAGNOSIS — N971 Female infertility of tubal origin: Secondary | ICD-10-CM

## 2012-04-27 ENCOUNTER — Telehealth: Payer: Self-pay | Admitting: Obstetrics and Gynecology

## 2012-04-27 ENCOUNTER — Other Ambulatory Visit (INDEPENDENT_AMBULATORY_CARE_PROVIDER_SITE_OTHER): Payer: 59

## 2012-04-27 DIAGNOSIS — N979 Female infertility, unspecified: Secondary | ICD-10-CM

## 2012-04-27 NOTE — Telephone Encounter (Signed)
The patient called on the telephone.  Ultrasound results reviewed.  No ovulation.  The patient will followup with Dr. Mancel Bale.  Dr. Raphael Gibney

## 2012-04-29 ENCOUNTER — Other Ambulatory Visit: Payer: Self-pay | Admitting: Gastroenterology

## 2012-05-23 ENCOUNTER — Other Ambulatory Visit: Payer: Self-pay

## 2012-05-23 ENCOUNTER — Ambulatory Visit (INDEPENDENT_AMBULATORY_CARE_PROVIDER_SITE_OTHER): Payer: 59

## 2012-05-23 DIAGNOSIS — N979 Female infertility, unspecified: Secondary | ICD-10-CM

## 2012-05-23 DIAGNOSIS — N970 Female infertility associated with anovulation: Secondary | ICD-10-CM

## 2012-05-24 ENCOUNTER — Ambulatory Visit (INDEPENDENT_AMBULATORY_CARE_PROVIDER_SITE_OTHER): Payer: 59

## 2012-05-24 ENCOUNTER — Other Ambulatory Visit (INDEPENDENT_AMBULATORY_CARE_PROVIDER_SITE_OTHER): Payer: 59

## 2012-05-24 DIAGNOSIS — N970 Female infertility associated with anovulation: Secondary | ICD-10-CM

## 2012-05-24 DIAGNOSIS — N979 Female infertility, unspecified: Secondary | ICD-10-CM

## 2012-05-24 MED ORDER — CHORIOGONADOTROPIN ALFA 250 MCG/0.5ML ~~LOC~~ INJ
INJECTION | Freq: Once | SUBCUTANEOUS | Status: AC
  Administered 2012-05-24: 11:00:00 via SUBCUTANEOUS

## 2012-05-25 ENCOUNTER — Ambulatory Visit (INDEPENDENT_AMBULATORY_CARE_PROVIDER_SITE_OTHER): Payer: 59 | Admitting: Obstetrics and Gynecology

## 2012-05-25 ENCOUNTER — Encounter: Payer: Self-pay | Admitting: Obstetrics and Gynecology

## 2012-05-25 VITALS — BP 100/74 | Wt 115.0 lb

## 2012-05-25 DIAGNOSIS — N970 Female infertility associated with anovulation: Secondary | ICD-10-CM

## 2012-05-25 NOTE — Progress Notes (Signed)
INTRA-UTERINE INSEMINATION PROCEDURE   Anna Berger is a 36 y.o. female No obstetric history on file. who presents for IUI # 2  Consent signed:  yes  Indication: anovulatory infertility  Current medication: Femara 5 mg Follicle study: Yes  76-15-18 measuring 2 cm Trigger received: Yes 05-24-12 Ovidrel  SPERM [place]:  Pre-wash   Count: 22.5 M  Post-wash      Count: 46.3 M                          Mobility: 30.6 %   Mobility:   Decreased   IUI performed per protocol  with Tenaculum  Patient left in dorsal decubitus with pelvis elevated for 20 minutes  Return: 1 week for Progesterone level   05/25/2012  9:49 AM  .

## 2012-06-01 ENCOUNTER — Other Ambulatory Visit: Payer: 59

## 2012-06-01 DIAGNOSIS — N970 Female infertility associated with anovulation: Secondary | ICD-10-CM

## 2012-06-01 LAB — PROGESTERONE: Progesterone: 10.4 ng/mL

## 2012-06-02 NOTE — Progress Notes (Signed)
Quick Note:  Please inform progesterone is ovulatory ______

## 2012-06-07 ENCOUNTER — Telehealth: Payer: Self-pay

## 2012-06-07 NOTE — Telephone Encounter (Signed)
Spoke to pt and answered ?'s she had about plan of action as far as the results of her last 21 day progesterone= 10.4.  Per AR, she will just plan to give Ovidrel at the time of a slightly larger follicle this cycle. If no success after 3-4 trials of IUI's, then pt will need to be referred out to [facility] where they have additional options as far as follistim. Pt is agreeable and will call me with day 1 of next cycle to get follicle study scheduled. Carloyn Manner A

## 2012-06-12 ENCOUNTER — Telehealth: Payer: Self-pay

## 2012-06-12 NOTE — Telephone Encounter (Signed)
Message copied by Providence Lanius on Tue Jun 12, 2012  2:56 PM ------      Message from: Kevan Ny      Created: Tue Jun 12, 2012 11:16 AM                   ----- Message -----         From: Alwyn Pea, MD         Sent: 06/02/2012   9:35 AM           To: Jake Shark, CMA            Please inform progesterone is ovulatory

## 2012-06-12 NOTE — Telephone Encounter (Signed)
Pt was informed on 06/07/12 by JO of levels.  Providence Lanius, [place]

## 2012-07-09 ENCOUNTER — Telehealth: Payer: Self-pay | Admitting: Gastroenterology

## 2012-07-09 NOTE — Telephone Encounter (Signed)
Called patient back. Advised patient I left Lialda discount card up front for her to pick up. Patient said that she will be here on Wed.

## 2012-07-24 ENCOUNTER — Telehealth: Payer: Self-pay

## 2012-07-24 NOTE — Telephone Encounter (Signed)
LM for pt that I did get her message yesterday. Per AR, yes, we can refer her out to [facility] for Infertility. I will call her as soon as I get appt date and time. Carloyn Manner A

## 2012-07-24 NOTE — Telephone Encounter (Signed)
Spoke to pt to let her know that I have made referral to Dr. Rolin Barry at [facility]. 08/08/2012 is her appt @ 11:00. All pertinent records, notes, labs and follicle studies faxed to him. REI packet sent to pt in mail today. Carloyn Manner A

## 2012-09-08 IMAGING — US US FOLLICLE
1 series · 14 of 16 positions shown · non-contrast
Comparison: None.

CLINICAL DATA: Infertility.  Ovarian follicle study.

TRANSVAGINAL ULTRASOUND OF PELVIS
TECHNIQUE: Transvaginal ultrasound examination of the pelvis was
performed including evaluation of the uterus, ovaries, adnexal
regions, and pelvic cul-de-sac.

[Series 1: us follicle · 14 of 40 slices shown]
[im 1/40]
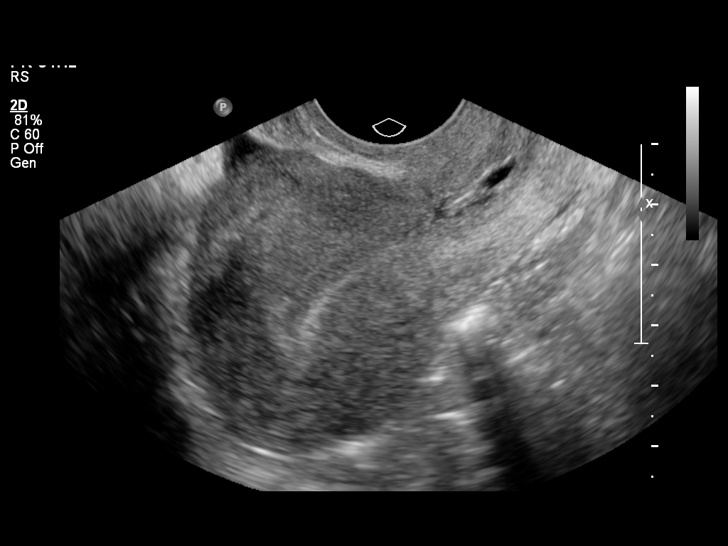
[im 3/40]
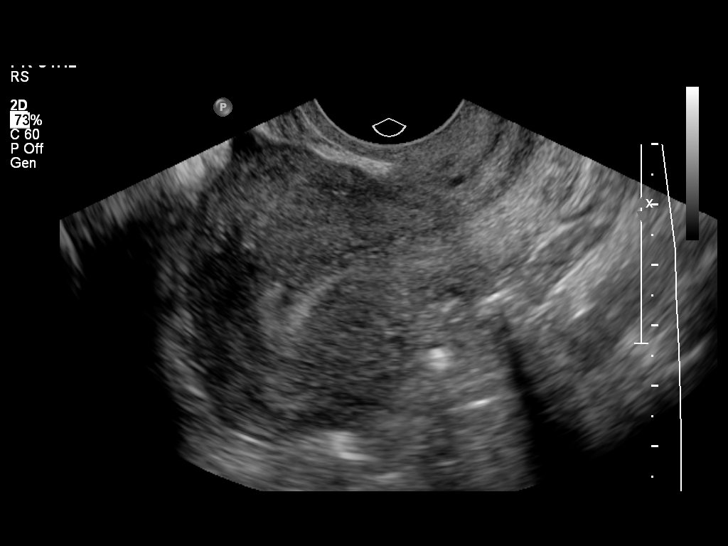
[im 6/40]
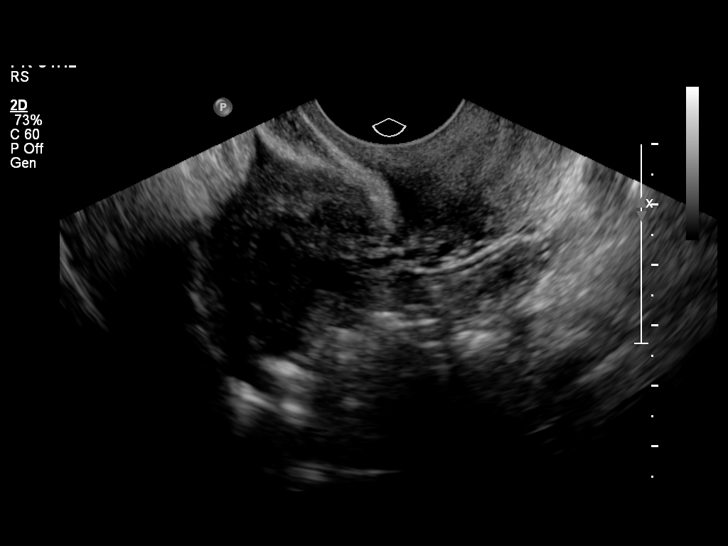
[im 11/40]
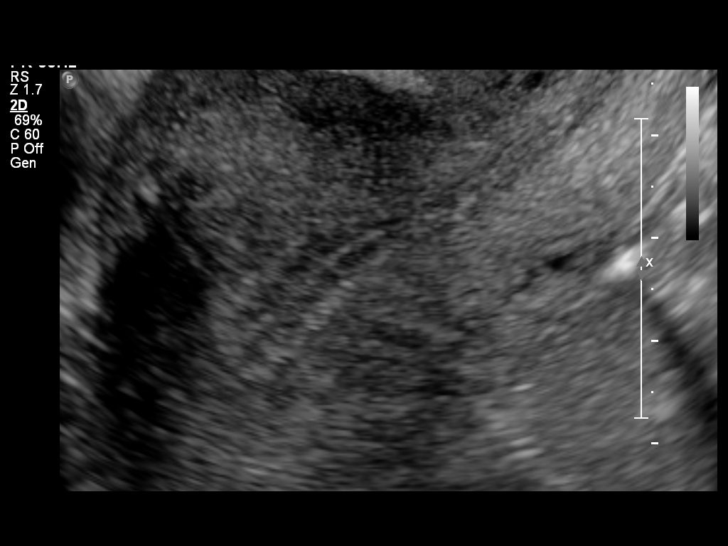
[im 14/40]
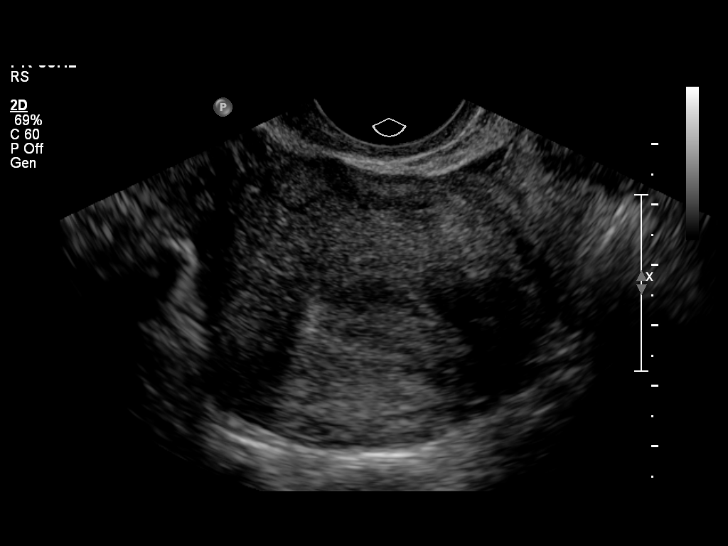
[im 16/40]
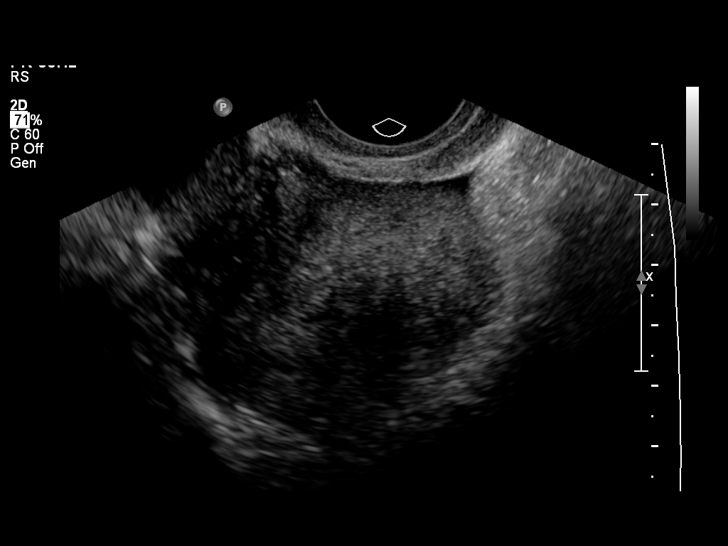
[im 19/40]
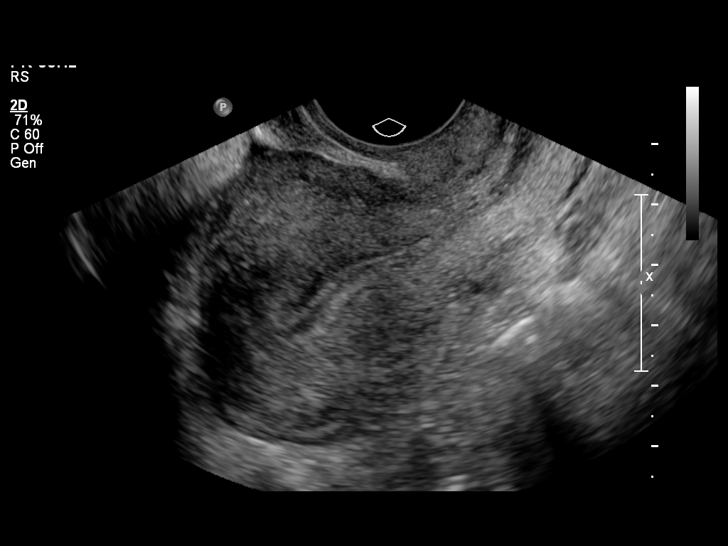
[im 21/40]
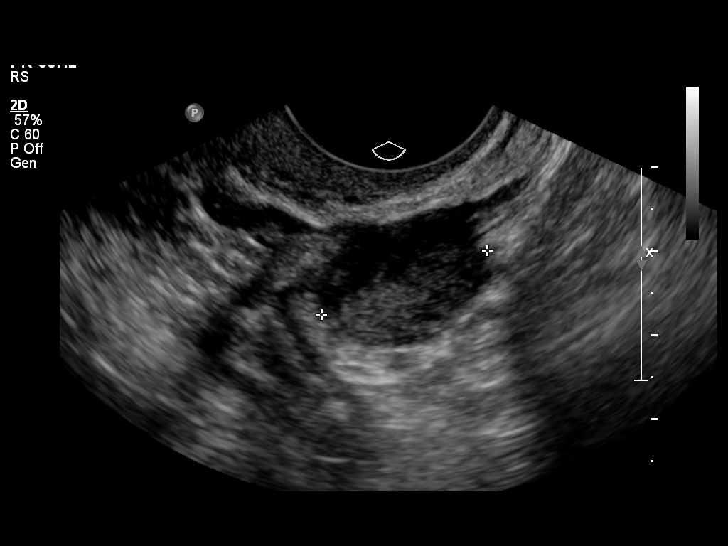
[im 24/40]
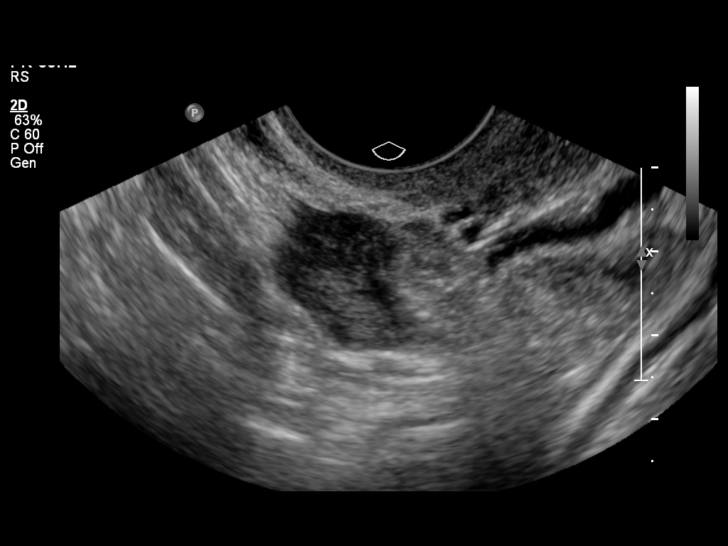
[im 27/40]
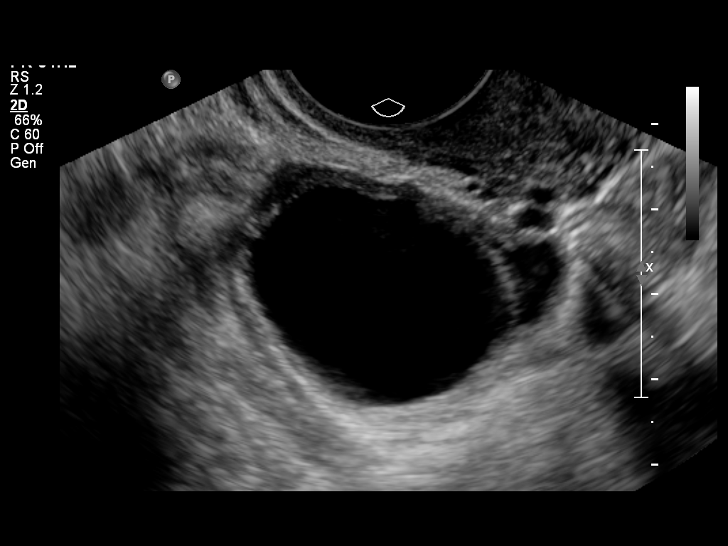
[im 32/40]
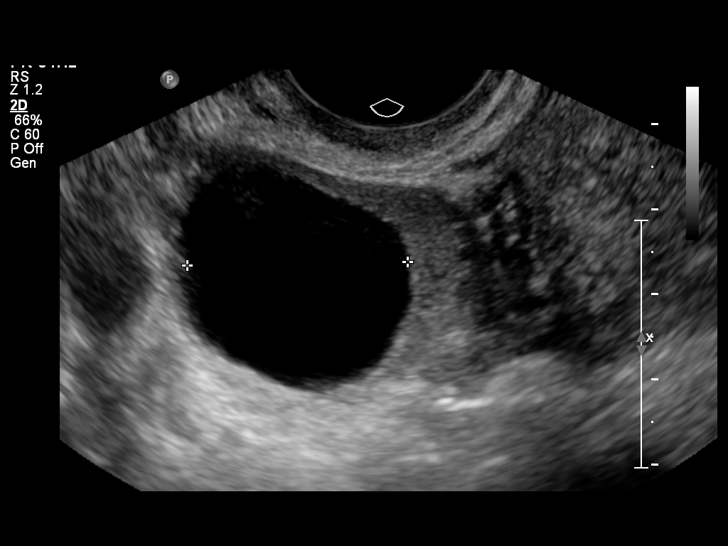
[im 34/40]
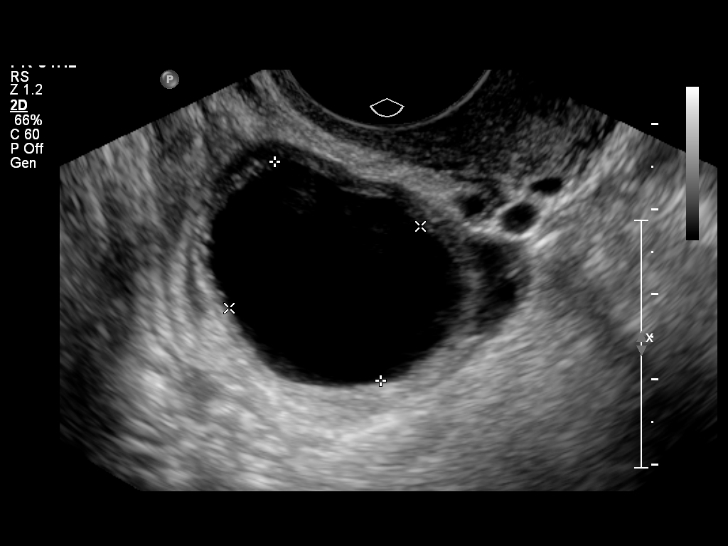
[im 37/40]
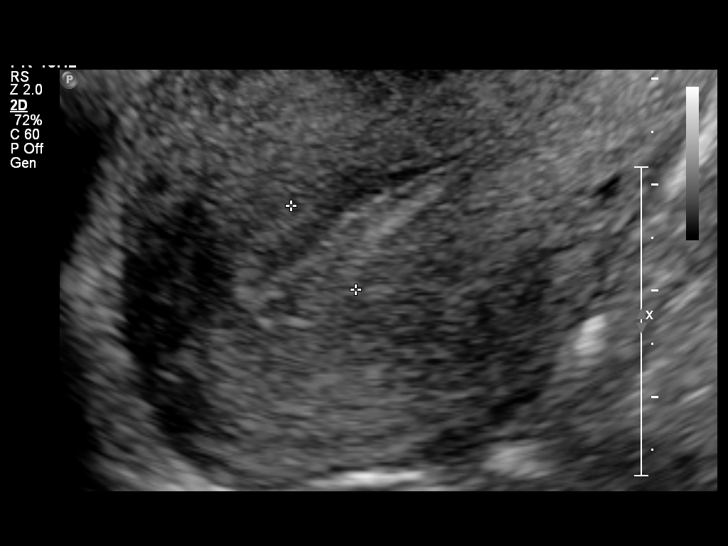
[im 40/40]
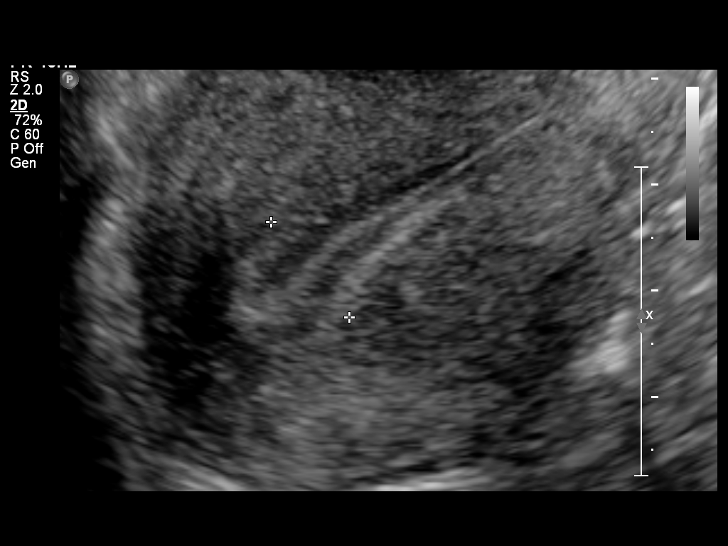

[14 of 16 positions shown; findings below may reference images not displayed]

FINDINGS: Uterus:  7.6 x 4.9 by 6.0 cm.  No fibroids identified.

Endometrium: 10 mm double layer thickness.  Normal trilayered
appearance.

Right ovary: A single follicle is seen which measures 28 x 25 x 26
mm.  No other follicles visualized in the right ovary.  No complex
cystic or solid ovarian mass identified.

Left ovary: No ovarian follicles are visualized.  No cystic or
solid ovarian masses identified.

Other Findings:  No free fluid
IMPRESSION: 1.   Solitary right ovarian follicle measuring 28 x 25 x 26 mm.  No
other follicles of any size visualized in either ovary.
2.  Trilayered endometrium measuring 10 mm in thickness.

## 2012-09-26 ENCOUNTER — Other Ambulatory Visit: Payer: Self-pay | Admitting: Gastroenterology

## 2012-09-26 NOTE — Telephone Encounter (Signed)
PATIENT WILL NEED OFFICE VISIT FOR FURTHER REFILLS

## 2012-10-30 ENCOUNTER — Telehealth: Payer: Self-pay | Admitting: Gastroenterology

## 2012-10-30 MED ORDER — PEG-KCL-NACL-NASULF-NA ASC-C 100 G PO SOLR: ORAL | Status: DC

## 2012-10-30 NOTE — Telephone Encounter (Signed)
Pt needs COLON q91yr per notes. She will come 01/14/13. Mailed info to her. She will fax or mail back the consent. Moviprep ordered.

## 2012-11-02 NOTE — Telephone Encounter (Signed)
Received fax from pt that she has received her COLON paperwork.

## 2012-12-23 ENCOUNTER — Other Ambulatory Visit: Payer: Self-pay | Admitting: Gastroenterology

## 2013-01-14 ENCOUNTER — Other Ambulatory Visit: Payer: Self-pay | Admitting: *Deleted

## 2013-01-14 ENCOUNTER — Encounter: Payer: Self-pay | Admitting: Gastroenterology

## 2013-01-14 ENCOUNTER — Ambulatory Visit (AMBULATORY_SURGERY_CENTER): Payer: 59 | Admitting: Gastroenterology

## 2013-01-14 VITALS — BP 109/67 | HR 73 | Temp 98.2°F | Resp 19 | Ht 63.0 in | Wt 115.0 lb

## 2013-01-14 DIAGNOSIS — K5289 Other specified noninfective gastroenteritis and colitis: Secondary | ICD-10-CM

## 2013-01-14 DIAGNOSIS — Z800 Family history of malignant neoplasm of digestive organs: Secondary | ICD-10-CM

## 2013-01-14 DIAGNOSIS — D126 Benign neoplasm of colon, unspecified: Secondary | ICD-10-CM

## 2013-01-14 DIAGNOSIS — K5190 Ulcerative colitis, unspecified, without complications: Secondary | ICD-10-CM

## 2013-01-14 DIAGNOSIS — D500 Iron deficiency anemia secondary to blood loss (chronic): Secondary | ICD-10-CM

## 2013-01-14 DIAGNOSIS — Z1211 Encounter for screening for malignant neoplasm of colon: Secondary | ICD-10-CM

## 2013-01-14 MED ORDER — SODIUM CHLORIDE 0.9 % IV SOLN: 500.0000 mL | INTRAVENOUS | Status: DC

## 2013-01-14 MED ORDER — MESALAMINE 1000 MG RE SUPP: 1000.0000 mg | Freq: Every day | RECTAL | Status: DC

## 2013-01-14 MED ORDER — MESALAMINE 1.2 G PO TBEC: 1200.0000 mg | DELAYED_RELEASE_TABLET | Freq: Every day | ORAL | Status: DC

## 2013-01-14 NOTE — Progress Notes (Signed)
Called to room to assist during endoscopic procedure.  Patient ID and intended procedure confirmed with present staff. Received instructions for my participation in the procedure from the performing physician.  

## 2013-01-14 NOTE — Patient Instructions (Signed)
FOLLOW UP APPOINTMENT WITH DR PATTERSON ON SEPT. [phone] @8 :49 AM IN HIS OFFICE  LIALDA REFILLED   CANASA 1 GM SUPPOSITORIES PER DR PATTERSONS INSTRUCTIONS  YOU HAD AN ENDOSCOPIC PROCEDURE TODAY AT [facility]: Refer to the procedure report that was given to you for any specific questions about what was found during the examination.  If the procedure report does not answer your questions, please call your gastroenterologist to clarify.  If you requested that your care partner not be given the details of your procedure findings, then the procedure report has been included in a sealed envelope for you to review at your convenience later.  YOU SHOULD EXPECT: Some feelings of bloating in the abdomen. Passage of more gas than usual.  Walking can help get rid of the air that was put into your GI tract during the procedure and reduce the bloating. If you had a lower endoscopy (such as a colonoscopy or flexible sigmoidoscopy) you may notice spotting of blood in your stool or on the toilet paper. If you underwent a bowel prep for your procedure, then you may not have a normal bowel movement for a few days.  DIET: Your first meal following the procedure should be a light meal and then it is ok to progress to your normal diet.  A half-sandwich or bowl of soup is an example of a good first meal.  Heavy or fried foods are harder to digest and may make you feel nauseous or bloated.  Likewise meals heavy in dairy and vegetables can cause extra gas to form and this can also increase the bloating.  Drink plenty of fluids but you should avoid alcoholic beverages for 24 hours.  ACTIVITY: Your care partner should take you home directly after the procedure.  You should plan to take it easy, moving slowly for the rest of the day.  You can resume normal activity the day after the procedure however you should NOT DRIVE or use heavy machinery for 24 hours (because of the sedation medicines used during the  test).    SYMPTOMS TO REPORT IMMEDIATELY: A gastroenterologist can be reached at any hour.  During normal business hours, 8:30 AM to 5:00 PM Monday through Friday, call [phone].  After hours and on weekends, please call the GI answering service at [phone] who will take a message and have the physician on call contact you.   Following lower endoscopy (colonoscopy or flexible sigmoidoscopy):  Excessive amounts of blood in the stool  Significant tenderness or worsening of abdominal pains  Swelling of the abdomen that is new, acute  Fever of 100F or higher    FOLLOW UP: If any biopsies were taken you will be contacted by phone or by letter within the next 1-3 weeks.  Call your gastroenterologist if you have not heard about the biopsies in 3 weeks.  Our staff will call the home number listed on your records the next business day following your procedure to check on you and address any questions or concerns that you may have at that time regarding the information given to you following your procedure. This is a courtesy call and so if there is no answer at the home number and we have not heard from you through the emergency physician on call, we will assume that you have returned to your regular daily activities without incident.  SIGNATURES/CONFIDENTIALITY: You and/or your care partner have signed paperwork which will be entered into your electronic medical record.  These signatures attest to the fact that that the information above on your After Visit Summary has been reviewed and is understood.  Full responsibility of the confidentiality of this discharge information lies with you and/or your care-partner.    INFORMATION ON ULCERATIVE COLITIS & PROCTITIS GIVEN TO YOU TODAY

## 2013-01-14 NOTE — Progress Notes (Signed)
Patient did not experience any of the following events: a burn prior to discharge; a fall within the facility; wrong site/side/patient/procedure/implant event; or a hospital transfer or hospital admission upon discharge from the facility. (G8907) Patient did not have preoperative order for IV antibiotic SSI prophylaxis. (G8918)  

## 2013-01-14 NOTE — Op Note (Signed)
[place]  [address]. [place], [id number]   COLONOSCOPY PROCEDURE REPORT  PATIENT: Aldina, Porta  MR#: [id number] BIRTHDATE: 1975/08/30 , 36  yrs. old GENDER: Female ENDOSCOPIST: Sable Feil, MD, [facility] REFERRED BY: PROCEDURE DATE:  01/14/2013 PROCEDURE:   Colonoscopy with biopsy History of Adenoma - Now for follow-up colonoscopy & has been > or = to 3 yrs.  N/A ASA CLASS:   Class II INDICATIONS: MEDICATIONS: propofol (Diprivan) 259m IV  DESCRIPTION OF PROCEDURE:   After the risks benefits and alternatives of the procedure were thoroughly explained, informed consent was obtained.  A digital rectal exam revealed no abnormalities of the rectum.   The LB [id number] endoscope was introduced through the anus and advanced to the cecum, which was identified by both the appendix and ileocecal valve. No adverse events experienced.   The quality of the prep was excellent, using MoviPrep  The instrument was then slowly withdrawn as the colon was fully examined.    The Colonoscope was advanced easily through a shortened colon into the cecum.  There is some scarring of the mucosa of the colon but no active colitis.  Withdrawal throughout the length colon was performed and biopsies were obtained in a random fashion from the right colon, transverse colon  and left colon.  There is rather severe proctitis from 0-15 cm and these bio[psies wer labeled specimen jar #3. There are ulcerations, exudate, and mucosal friability noted here but no polypoid lesions.      The time to cecum=2 minutes 56 seconds.  Withdrawal time=6 minutes 0 seconds.  The scope was withdrawn and the procedure completed. COMPLICATIONS: There were no complications.  ENDOSCOPIC IMPRESSION: chronic ulcerative colitis with a foreshortened colon and some scarring in the upper colon.  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  Biopsies were obtained a day for dysplasia  screening. RECOMMENDATIONS: 1.  Await biopsy results 2.   office for follow-up appointment in  1 month we will await biopsy results to exclude dysplasia.  This patient has been having every two-year biopsies for dysplasia.  I have added Canasa 1 g suppositories to her regime for the next month.  I also asked her followup with me in the office in one month's time  eSigned:  DSable Feil MD, [facility]08/04/2013 8:59 AM   cc:   PATIENT NAME:  SJazmynn, PhoMR#: [id number]

## 2013-01-14 NOTE — Progress Notes (Signed)
Procedure ends, to recovery, report given, VSS.

## 2013-01-15 ENCOUNTER — Telehealth: Payer: Self-pay | Admitting: *Deleted

## 2013-01-15 NOTE — Telephone Encounter (Signed)
No answer, left message to call if questions or concerns. 

## 2013-01-18 ENCOUNTER — Encounter: Payer: Self-pay | Admitting: Gastroenterology

## 2013-02-14 ENCOUNTER — Ambulatory Visit: Payer: 59 | Admitting: Gastroenterology

## 2013-03-21 ENCOUNTER — Ambulatory Visit (INDEPENDENT_AMBULATORY_CARE_PROVIDER_SITE_OTHER): Payer: 59 | Admitting: Gastroenterology

## 2013-03-21 ENCOUNTER — Encounter: Payer: Self-pay | Admitting: Gastroenterology

## 2013-03-21 VITALS — BP 110/74 | HR 94 | Ht 63.25 in | Wt 116.2 lb

## 2013-03-21 DIAGNOSIS — K5190 Ulcerative colitis, unspecified, without complications: Secondary | ICD-10-CM

## 2013-03-21 MED ORDER — MESALAMINE 1000 MG RE SUPP: 1000.0000 mg | Freq: Every day | RECTAL | Status: DC

## 2013-03-21 NOTE — Progress Notes (Signed)
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  She recently underwent dysplasia screening, there was no evidence of any raised lesions and biopsy showed no dysplasia.  Because of some active proctitis she was placed on Canasa 1 g suppositories at bedtime in addition to her Lialda 2.4 g a day she takes chronically.  She currently is asymptomatic and denies any GI complaints.  She states she is fairly well and denies, but we will send her a list of immunizations to be sure.  She does take Nexium 20 mg a day for chronic GERD and is on B12 replacement therapy.  Current Medications, Allergies, Past Medical History, Past Surgical History, Family History and Social History were reviewed in [employer] record.  ROS: All systems were reviewed and are negative unless otherwise stated in the HPI.          Physical Exam: At pressure 110/74, pulse 94 and regular weight 116 the BMI of 20.42.  There no stigmata of chronic liver disease.  Her abdomen shows no organomegaly, masses, tenderness, and bowel sounds are normal.  Mental status is normal    Assessment and Plan: Long-standing ulcerative colitis doing well on maintenance aminosalicylate therapy with recent addition of topical suppository aminosalicylate therapy per active proctitis seen on colonoscopy exam.  She is to continue her medications regularly as tolerated, and will schedule her to see Dr. Billie Lade in followup in 4 months time so he can assume her care.  In the interim, we'll send her a list of immunizations that she needs to be on top of in case she needs more aggressive therapy in the future.  I'll have Amy  Esterwood my  physician's assistant send her a list of recommended immunizations, PPD, et Ronney Asters.   Cc Dr [name] and Amy Hubert Azure

## 2013-03-21 NOTE — Patient Instructions (Signed)
Please make a follow up appointment in March(schedule is not out yet, so you will have to call back to schedule that)  New prescription for Canasa Suppositories was sent to your pharmacy, please use at bedtime  Continue your Lialda

## 2013-03-27 ENCOUNTER — Telehealth: Payer: Self-pay | Admitting: *Deleted

## 2013-03-27 NOTE — Telephone Encounter (Signed)
Rollene Fare- can you call this pt, and find out about recent flu shot, pneumococcal vaccine, and have her get a PPD if not done ... Thanks ----- Message ----- From: Carlyle Dolly, CMA Sent: 03/21/2013 2:00 PM To: Amy Genia Harold, PA-C Left a message for patient to call me.

## 2013-03-28 NOTE — Telephone Encounter (Signed)
Left a message for patient to call me. 

## 2013-03-28 NOTE — Telephone Encounter (Signed)
Spoke with patient and gave her recommendations. She states she had the flu Mist 2 weeks ago. She is going to have the Pneumococcal, Shingles vaccine in November. She has had a PPD and will get the results for [place].

## 2013-05-07 ENCOUNTER — Other Ambulatory Visit: Payer: Self-pay | Admitting: Gastroenterology

## 2013-05-10 ENCOUNTER — Encounter: Payer: Self-pay | Admitting: Gastroenterology

## 2013-07-08 ENCOUNTER — Other Ambulatory Visit: Payer: Self-pay

## 2013-07-08 LAB — OB RESULTS CONSOLE ABO/RH: RH Type: POSITIVE

## 2013-07-08 LAB — OB RESULTS CONSOLE HEPATITIS B SURFACE ANTIGEN: Hepatitis B Surface Ag: NEGATIVE

## 2013-07-08 LAB — OB RESULTS CONSOLE RPR: RPR: NONREACTIVE

## 2013-07-08 LAB — OB RESULTS CONSOLE RUBELLA ANTIBODY, IGM: Rubella: IMMUNE

## 2013-07-08 LAB — OB RESULTS CONSOLE ANTIBODY SCREEN: Antibody Screen: NEGATIVE

## 2013-07-08 LAB — OB RESULTS CONSOLE HIV ANTIBODY (ROUTINE TESTING): HIV: NONREACTIVE

## 2013-07-12 ENCOUNTER — Encounter (HOSPITAL_COMMUNITY): Payer: Self-pay | Admitting: Obstetrics and Gynecology

## 2013-07-17 ENCOUNTER — Encounter: Payer: Self-pay | Admitting: Internal Medicine

## 2013-08-02 ENCOUNTER — Ambulatory Visit (HOSPITAL_COMMUNITY)
Admission: RE | Admit: 2013-08-02 | Discharge: 2013-08-02 | Disposition: A | Discharge status: Home / Self Care | Payer: 59 | Source: Ambulatory Visit | Attending: Obstetrics and Gynecology | Admitting: Obstetrics and Gynecology

## 2013-08-02 NOTE — Consult Note (Addendum)
MFM Consult Staff Note  1. 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 While Factor V Leiden mutation heterozygosity confers risk for a first unprovoked DVT/VTE, studies indicate that in the absence of other major risk factors for DVT/VTE, their risk of thromboembolism is likely less than 1 percent. Therefore, asymptomatic women (no prior VTE) who are heterozygotes for FVL do not routinely require antepartum anticoagulation.    However, it is recommended to counsel patients for warning signs of DVT/VTE (eg, leg pain, swelling, warmth, erythema, shortness of breath and chest pain) and to monitor them by routine physical examination (with documentation of presence/absence of calf tenderness/edema/redness) during routine prenatal visits.    Your patient indicated that she planned to have a repeat cesarean section.  Given that cesarean delivery is a risk factor for VTE and the greatest majority of fatal pulmonary embolisms in postpartum women occur after cesarean delivery, I recommend the following:  1. neumatic compression device; and, 2. postpartum pharmacologic prophylaxis (my recommendation for pharmacologic prophylaxis Lovenox 72m [place] qday for 6 weeks); 3. Pharmacologic prophylaxis in the event of immobilization (eg, admission for preterm labor).  2. Ulcerative colitis:  I recommend continuing mesalamine and regular follow up with GI medicine.  Given increased risk for LBW/IUGR, I recommend interval growth surveillance every 6 weeks beginning at 24 weeks until delivery.  Unless fetal growth restriction, oligohydramnios, or other complication of pregnancy arises, she will not need antenatal testing in the third trimester for ulcerative colitis.  Given the increased risk of a flare in the postpartum period, I recommend assessment by GI medicine around 4-6 weeks  postpartum.  3. Advanced maternal age:  I recommended genetic counseling and first trimester ultrasound at 12-13 weeks, and she inquired about having this evaluation in our office (scheduled today).  During the genetic counseling appointment she will make a determination of whether she desires risk assessment by First Trimester Screen or by First Trimester NT/NB with Panorama (noninvasive prenatal screening by cell free fetal DNA).  She will also have a targeted fetal survey and requested to have this evaluation in our office at 18 weeks (scheduled today).    4. Low dose aspirin therapy:  Given the most updated recommendations by the UHungaryon Aspirin 817mpo daily in women with risk factors for intrauterine growth restriction, preeclampsia and still birth, I agree that this is appropriate for this female with AMA and a low risk thrombophilia.     Time Spent:  I spent in excess of 45 minutes in consultation with this patient to review records, evaluate her case, and provide her with an adequate discussion and education.  More than 50% of this time was spent in direct face-to-face counseling.  It was a pleasure seeing your patient in the office today.  Thank you for consultation. Please do not hesitate to contact our service for any further questions.   Thank you,  JeDelman CheadleeHarl FavorJeDelman CheadleMD, MS, FACOG Assistant Professor Section of [place]

## 2013-08-13 ENCOUNTER — Other Ambulatory Visit: Payer: Self-pay | Admitting: Obstetrics and Gynecology

## 2013-08-13 DIAGNOSIS — Z3682 Encounter for antenatal screening for nuchal translucency: Secondary | ICD-10-CM

## 2013-08-16 ENCOUNTER — Other Ambulatory Visit: Payer: Self-pay

## 2013-08-16 ENCOUNTER — Ambulatory Visit (HOSPITAL_COMMUNITY)
Admission: RE | Admit: 2013-08-16 | Discharge: 2013-08-16 | Disposition: A | Discharge status: Home / Self Care | Payer: 59 | Source: Ambulatory Visit | Attending: Obstetrics and Gynecology | Admitting: Obstetrics and Gynecology

## 2013-08-16 ENCOUNTER — Encounter (HOSPITAL_COMMUNITY): Payer: Self-pay

## 2013-08-16 DIAGNOSIS — O09529 Supervision of elderly multigravida, unspecified trimester: Secondary | ICD-10-CM | POA: Insufficient documentation

## 2013-08-16 DIAGNOSIS — O3510X0 Maternal care for (suspected) chromosomal abnormality in fetus, unspecified, not applicable or unspecified: Secondary | ICD-10-CM | POA: Insufficient documentation

## 2013-08-16 DIAGNOSIS — Z3689 Encounter for other specified antenatal screening: Secondary | ICD-10-CM | POA: Insufficient documentation

## 2013-08-16 DIAGNOSIS — O351XX0 Maternal care for (suspected) chromosomal abnormality in fetus, not applicable or unspecified: Secondary | ICD-10-CM | POA: Insufficient documentation

## 2013-08-16 DIAGNOSIS — Z3682 Encounter for antenatal screening for nuchal translucency: Secondary | ICD-10-CM

## 2013-08-16 NOTE — ED Notes (Signed)
Appointment Date: 08/16/2013 DOB: [birth year] Referring Provider: Betsy Coder, MD Attending: Dr. Renella Berger  Anna Berger and her husband, Anna Berger, were seen for genetic counseling because of a maternal age of 38 y.o.Marland Kitchen     They were counseled regarding maternal age and the association with risk for chromosome conditions due to nondisjunction with aging of the ova.   We reviewed chromosomes, nondisjunction, and the associated 1 in 27 risk for fetal aneuploidy related to a maternal age of 38 y.o. at 41w4dgestation. They were counseled that the risk for aneuploidy decreases as gestational age increases, accounting for those pregnancies which spontaneously abort.  We specifically discussed Down syndrome (trisomy 233, trisomies 173and 174 and sex chromosome aneuploidies (47,XXX and 47,XXY) including the common features and prognoses of each.   We reviewed available screening options including First Screen, noninvasive prenatal screening (NIPS)/cell free fetal DNA (cffDNA) testing, and detailed ultrasound.  They were counseled that screening tests are used to modify a patient's a priori risk for aneuploidy, typically based on age. This estimate provides a pregnancy specific risk assessment. We reviewed the benefits and limitations of each option. Specifically, we discussed the conditions for which each test screens, the detection rates, and false positive rates of each. They were also counseled regarding diagnostic testing via [facility] and amniocentesis. We reviewed the approximate 1 in 3740-814risk for complications for amniocentesis, including spontaneous pregnancy loss. After consideration of all the options, they elected to proceed with NIPS.  Those results will be available in 8-10 days.    They also expressed interest in pursuing a nuchal translucency ultrasound, which was performed today.  The report will be documented separately.  They understand that screening tests cannot rule out all  birth defects or genetic syndromes. The patient was advised of this limitation and states she still does not want diagnostic testing at this time.   Both family histories were reviewed and found to be noncontributory for birth defects, intellectual disability, and known genetic conditions. Without further information regarding the provided family history, an accurate genetic risk cannot be calculated. Further genetic counseling is warranted if more information is obtained.  Anna Berger denied exposure to environmental toxins or chemical agents. She denied the use of alcohol, tobacco or street drugs. She denied significant viral illnesses during the course of her pregnancy. Her medical and surgical histories were previously reviewed and discussed with Anna Berger see earlier consult note for full details of his consultation with Anna Berger.   I counseled this couple regarding the above risks and available options.  The approximate face-to-face time with the genetic counselor was 30 minutes.  Anna Hai MS,  Certified Genetic Counselor

## 2013-08-27 ENCOUNTER — Encounter: Payer: Self-pay | Admitting: Internal Medicine

## 2013-08-28 ENCOUNTER — Ambulatory Visit (INDEPENDENT_AMBULATORY_CARE_PROVIDER_SITE_OTHER): Payer: 59 | Admitting: Internal Medicine

## 2013-08-28 ENCOUNTER — Encounter: Payer: Self-pay | Admitting: Internal Medicine

## 2013-08-28 VITALS — BP 100/70 | HR 68 | Ht 63.0 in | Wt 123.8 lb

## 2013-08-28 DIAGNOSIS — K219 Gastro-esophageal reflux disease without esophagitis: Secondary | ICD-10-CM

## 2013-08-28 DIAGNOSIS — E538 Deficiency of other specified B group vitamins: Secondary | ICD-10-CM

## 2013-08-28 DIAGNOSIS — O0001 Abdominal pregnancy with intrauterine pregnancy: Secondary | ICD-10-CM

## 2013-08-28 DIAGNOSIS — K5190 Ulcerative colitis, unspecified, without complications: Secondary | ICD-10-CM

## 2013-08-28 DIAGNOSIS — R1013 Epigastric pain: Secondary | ICD-10-CM

## 2013-08-28 MED ORDER — CYANOCOBALAMIN 1000 MCG/ML IJ SOLN: INTRAMUSCULAR | Status: DC

## 2013-08-28 MED ORDER — RANITIDINE HCL 150 MG PO CAPS: 150.0000 mg | ORAL_CAPSULE | Freq: Two times a day (BID) | ORAL | Status: DC

## 2013-08-28 NOTE — Patient Instructions (Signed)
We have sent the following medications to your pharmacy for you to pick up at your convenience: Lialda; continue taking daily. Zantac and B-12  Call us with any flares  Follow up with Dr. Hilarie Fredrickson in office in November 2015

## 2013-08-28 NOTE — Progress Notes (Signed)
Patient ID: Anna Berger, female   DOB: 06-14-1975, 38 y.o.   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 She has previously been managed by Dr. Sharlett Iles before today's appointment. She has changed to my practice after his retirement and this is her first visit. She is here alone today.  She reports overall she is feeling well. She is using Lialda 1 capsule daily as well as Canasa at bedtime on an as-needed basis. She says that if she has bloody stools mucus in her stools or increased gas with left lower quadrant pain she uses Canasa. Today she reports she is feeling well and feels that her disease is in remission. She reports in November she developed some rectal bleeding with pain which she felt might be hemorrhoid related. She does have a history of external hemorrhoids but states she did not feel an external hemorrhoid during that painful bleeding episode. She describes pain with each passage of bowel movement and a throbbing pain after bowel movement. It often hurts to sit down. This lasted for about 2-3 weeks but resolved entirely. She reports her bowel habits are normal for her now which is usually 4-5 soft in semisolid stools in the morning but often no further bowel movements throughout the day or night. She denies rectal bleeding or melena presently. No significant bloating or abdominal tenderness recently. She does have a history of heartburn and reports in December she noted worsening of her heartburn but also epigastric abdominal pain which was described as an intense burning. Also around this time she been using ibuprofen for headaches. During this time she was using Zantac 150 twice daily, Nexium and Pepto-Bismol. She transiently tried pantoprazole but found this not helpful in the least. All of her  epigastric symptoms resolved in early January 2015. She did put herself on a bland diet during this time.  Of note she is now [redacted] weeks pregnant with her second child. Her son is 26 years old. The baby is due in late September 2015. This was a spontaneous pregnancy after having tried fertility treatments which did not result in pregnancy. They're very excited about this pregnancy. She has stopped PPI altogether when she found out about her pregnancy. She is using ranitidine 150 mg twice daily. Heartburn has been well controlled. She had some nausea during the first trimester but this has abated entirely. She reports a good appetite. As noted above no active symptoms of colitis at present and she reports her first pregnancy did not cause worsening of her IBD.  Her only treatment for IBD over the course of her lifetime had been steroids and 5-ASA's. She is biologic and immunomodulator nave   Past Medical History  Diagnosis Date  . Hyperlipemia   . Endometritis   . Iron deficiency anemia   . Factor V deficiency   . Unspecified vitamin D deficiency   . Insomnia, unspecified   . Family history of malignant neoplasm of gastrointestinal tract   . Vitamin B12 deficiency   . UC (ulcerative colitis)   . Clotting disorder   . GERD (gastroesophageal reflux disease)     Past Surgical History  Procedure Laterality Date  . Cesarean section  02/2006  . Ovarian cyst removal  2012  . Colonoscopy    . Polypectomy  Current Outpatient Prescriptions  Medication Sig Dispense Refill  . aspirin 81 MG tablet Take 81 mg by mouth daily.      . calcium carbonate (TUMS - DOSED IN MG ELEMENTAL CALCIUM) 500 MG chewable tablet Chew 1 tablet by mouth daily.      . cyanocobalamin (,VITAMIN B-12,) 1000 MCG/ML injection INJECT 1 ML INTO A MUSCLE ONCE A WEEK FOR 3 WEEKS,THEN ONCE A MONTH  10 mL  1  . LIALDA 1.2 G EC tablet TAKE 1 TABLET (1.2 G TOTAL) BY MOUTH EVERY MORNING.  30 tablet  5  . mesalamine (CANASA) 1000  MG suppository Place 1 suppository (1,000 mg total) rectally at bedtime.  30 suppository  6  . Prenatal Vit-Fe Fumarate-FA (PRENATAL MULTIVITAMIN) TABS tablet Take 1 tablet by mouth daily at 12 noon.      . ranitidine (ZANTAC) 150 MG capsule Take 1 capsule (150 mg total) by mouth 2 (two) times daily.  60 capsule  6  . zolpidem (AMBIEN) 5 MG tablet Take 5 mg by mouth at bedtime as needed for sleep.       No current facility-administered medications for this visit.    No Known Allergies  Family History  Problem Relation Age of Onset  . Hypertension Father   . Coronary artery disease Father   . Heart attack Father 15  . Hyperlipidemia Father   . Hyperlipidemia Brother   . Crohn's disease Maternal Uncle   . Colon cancer Maternal Uncle   . Colon polyps Paternal Aunt   . Diabetes Maternal Uncle     History  Substance Use Topics  . Smoking status: Never Smoker   . Smokeless tobacco: Never Used  . Alcohol Use: No     Comment: social    ROS: As per history of present illness, otherwise negative  BP 100/70  Pulse 68  Ht 5' 3"  (1.6 m)  Wt 123 lb 12.8 oz (56.155 kg)  BMI 21.94 kg/m2  LMP 05/20/2013 Constitutional: Well-developed and well-nourished. No distress. HEENT: Normocephalic and atraumatic. Oropharynx is clear and moist. No oropharyngeal exudate. Conjunctivae are normal.  No scleral icterus. Neck: Neck supple. Trachea midline. Cardiovascular: Normal rate, regular rhythm and intact distal pulses. No M/R/G Pulmonary/chest: Effort normal and breath sounds normal. No wheezing, rales or rhonchi. Abdominal: Soft, nontender, nondistended. Bowel sounds active throughout. There are no masses palpable. No hepatosplenomegaly. Extremities: no clubbing, cyanosis, or edema Lymphadenopathy: No cervical adenopathy noted. Neurological: Alert and oriented to person place and time. Skin: Skin is warm and dry. No rashes noted. Psychiatric: Normal mood and affect. Behavior is  normal.  RELEVANT LABS AND IMAGING: Prenatal labs scanned into the record, reviewed, performed in February 2015 -- unremarkable, normal hemoglobin  Colonoscopy 01/14/2013 -- foreshortened colon with scarring in the upper colon. Active proctitis 0-50 cm;  biopsies for dysplasia.  Pathology = right colon and left colon benign colonic mucosa. Negative for active inflammation, granulomas, dysplasia and malignancy. Rectal biopsy chronic active colitis consistent with IBD. 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   1.  Long-standing ulcerative colitis -- as of her last surveillance colonoscopy she was in clinical remission but not endoscopic remission. She did have distal active disease. She could not tolerate Lialda at 2.4 g daily due to constipation and that  she is taking 1.2 g daily. She is also using Canasa on occasion. For now given that she is in clinical remission I will continue Lialda 1.2 g daily and I have asked that she resume Canasa 1000 mg each bedtime if she has proctitis symptoms such as bleeding tenesmus or pain. We discussed escalation of therapy with the goal of endoscopic remission, but given her pregnancy we will defer this until after she gives birth. Hopefully the pregnancy will not augment her immune system in such a way that she flares. Made her aware of this possibility and that she should let me know immediately if symptoms worsen. She voices understanding. She will be due repeat surveillance colonoscopy with biopsies in August 2016 (we discussed the guidelines of every 1-2 years, she prefers every 3 years but is agreeable to every 2 years) --Lialda 1.2 g daily, Canasa 1000 mg each bedtime as needed --Up-to-date on Pneumovax and  flu vaccine --Needs bone mineral density test after she gives birth, we discussed this today --Notify me of any flares --Return in late October or November, after she gives birth, sooner if necessary  2.  B12 deficiency -- patient getting IM B12 monthly which she states has dramatically helped her energy levels. We will continue monthly IM B12  3.  GERD -- well-controlled ranitidine 150 mg twice daily. I asked that she discuss this with her obstetrician. She is avoiding PPI during pregnancy  4.  Epigastric pain, now resolved -- certainly she could have had gastro-duodenitis or even an ulcer. She was taking NSAIDs which she is now avoiding. Symptoms have abated. We discussed H. pylori stool testing, which she has chosen to defer for now. Given her long-standing heartburn, I have recommended upper endoscopy at the time of her next colonoscopy.

## 2013-08-30 ENCOUNTER — Telehealth (HOSPITAL_COMMUNITY): Payer: Self-pay | Admitting: Maternal and Fetal Medicine

## 2013-08-30 NOTE — Telephone Encounter (Signed)
Called Anna Berger to discuss her cell free fetal DNA test results.  Mrs. Anna Berger had Panorama testing through [facility].  Testing was offered because of AMA.   The patient was identified by name and DOB.  We reviewed that there was an issue in the lab and that they were not able to get a result and we will have to redraw the sample.  The only information reported was gender.  Testing was consistent with female gender.  The patient did wish to know gender.  She stated that she was fine with only learning the gender. She understands that we can not state that it is a normal amount of Y chromosome material and that she still has the same age related 1-2% risk.  She was not concerned about this risk, and stated she may come on Thursday 4/2 for a second blood sample after her appointment with her primary ob, or she may decide not to have it redrawn.  She understands that if it is not redrawn she has not had screening for chromosome conditions.  We discussed that this testing has not ruled out any genetic conditions.  All questions were answered to her satisfaction, she was encouraged to call with additional questions or concerns.  Cam Hai, MS Certified Genetic Counselor

## 2013-09-05 ENCOUNTER — Ambulatory Visit (HOSPITAL_COMMUNITY): Payer: 59

## 2013-09-11 ENCOUNTER — Other Ambulatory Visit: Payer: Self-pay | Admitting: Obstetrics and Gynecology

## 2013-09-11 DIAGNOSIS — Z3689 Encounter for other specified antenatal screening: Secondary | ICD-10-CM

## 2013-09-11 DIAGNOSIS — O09529 Supervision of elderly multigravida, unspecified trimester: Secondary | ICD-10-CM

## 2013-09-24 ENCOUNTER — Ambulatory Visit (HOSPITAL_COMMUNITY)
Admission: RE | Admit: 2013-09-24 | Discharge: 2013-09-24 | Disposition: A | Discharge status: Home / Self Care | Payer: 59 | Source: Ambulatory Visit | Attending: Obstetrics and Gynecology | Admitting: Obstetrics and Gynecology

## 2013-09-24 DIAGNOSIS — O09529 Supervision of elderly multigravida, unspecified trimester: Secondary | ICD-10-CM | POA: Insufficient documentation

## 2013-09-24 DIAGNOSIS — Z3689 Encounter for other specified antenatal screening: Secondary | ICD-10-CM | POA: Insufficient documentation

## 2013-11-10 ENCOUNTER — Other Ambulatory Visit: Payer: Self-pay | Admitting: Gastroenterology

## 2013-12-10 ENCOUNTER — Encounter: Payer: Self-pay | Admitting: Cardiovascular Disease

## 2013-12-10 ENCOUNTER — Ambulatory Visit (INDEPENDENT_AMBULATORY_CARE_PROVIDER_SITE_OTHER): Payer: 59 | Admitting: Cardiovascular Disease

## 2013-12-10 VITALS — BP 110/72 | HR 82 | Ht 63.0 in | Wt 145.0 lb

## 2013-12-10 DIAGNOSIS — E785 Hyperlipidemia, unspecified: Secondary | ICD-10-CM | POA: Insufficient documentation

## 2013-12-10 DIAGNOSIS — Z8249 Family history of ischemic heart disease and other diseases of the circulatory system: Secondary | ICD-10-CM | POA: Insufficient documentation

## 2013-12-10 DIAGNOSIS — R002 Palpitations: Secondary | ICD-10-CM

## 2013-12-10 NOTE — Patient Instructions (Signed)
Your physician recommends that you schedule a follow-up appointment in: 4 Months

## 2013-12-10 NOTE — Assessment & Plan Note (Addendum)
Patient is a 38 year old married Caucasian female mother of one 17-year-old son named Anna Berger. She is currently [redacted] weeks pregnant. She was referred by her OB/GYN because of new onset palpitations. She had no symptoms to her for this pregnancy. She does admit to caffeine intake as well as using stimulant such as Claritin-D. The palpitations began at about 15 weeks with her daily. She has had no episodes of presyncope. She denies chest pain or shortness of breath. Her cardiac risk factor profile is remarkable for family history of heart disease with a father who had his first myocardial function in his 41s. She also has hyperlipidemia on statin therapy which is no longer on currently since she is pregnant. We have talked about hormonal and volume shifts during pregnancy. At this point I do not feel compelled to order an echocardiogram ordered a monitor. I will see her back in 4 months, after she is given birth to reassess

## 2013-12-10 NOTE — Progress Notes (Signed)
12/10/2013 Anna Berger   July 18, 1975  [id number]  Primary Physician Carlos Levering, PA-C Primary Cardiologist: Lorretta Harp MD [name]   HPI:  Anna Berger is a 38 year old married pregnant female mother of one 62-year-old child he works as a Designer, jewellery in the pediatrics office.she was referred by her OB/GYN for evaluation of new-onset palpitations. Her crit was profile unremarkable for treated hyperlipidemia as well as family history of heart disease with a father who had his first myocardial infarction in his 78s. She's never had a heart attack or stroke. She denies chest pain or shortness of breath. History otherwise is remarkable for ulcerative colitis. She had no problems during her first pregnancy. Weeks and her current pregnancy started to notice palpitations which he now has a daily basis patient does admit to taking one cup of coffee a day today. The palpitations worsen by stimulants such as pseudoephedrine.   Current Outpatient Prescriptions  Medication Sig Dispense Refill  . aspirin 81 MG tablet Take 81 mg by mouth daily.      . cyanocobalamin (,VITAMIN B-12,) 1000 MCG/ML injection INJECT 1 ML INTO A MUSCLE ONCE A WEEK FOR 3 WEEKS,THEN ONCE A MONTH  10 mL  1  . doxylamine, Sleep, (UNISOM) 25 MG tablet Take 25 mg by mouth at bedtime as needed.      Marland Kitchen LIALDA 1.2 G EC tablet TAKE 1 TABLET BY MOUTH EVERY MORNING  30 tablet  5  . Prenatal Vit-Fe Fumarate-FA (PRENATAL MULTIVITAMIN) TABS tablet Take 1 tablet by mouth daily at 12 noon.      . ranitidine (ZANTAC) 150 MG capsule Take 1 capsule (150 mg total) by mouth 2 (two) times daily.  60 capsule  6  . zolpidem (AMBIEN) 5 MG tablet Take 5 mg by mouth at bedtime as needed for sleep.       No current facility-administered medications for this visit.    No Known Allergies  History   Social History  . Marital Status: Married    Spouse Name: N/A    Number of Children: 1  . Years of Education: N/A    Occupational History  . 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 Smoking status: Never Smoker   . Smokeless tobacco: Never Used  . Alcohol Use: No     Comment: social  . Drug Use: No  . Sexual Activity: Not on file   Other Topics Concern  . Not on file   Social History Narrative  . No narrative on file     Review of Systems: General: negative for chills, fever, night sweats or weight changes.  Cardiovascular: negative for chest pain, dyspnea on exertion, edema, orthopnea, palpitations, paroxysmal nocturnal dyspnea or shortness of breath Dermatological: negative for rash Respiratory: negative for cough or wheezing Urologic: negative for hematuria Abdominal: negative for nausea, vomiting, diarrhea, bright red blood per rectum, melena, or hematemesis Neurologic: negative for visual changes, syncope, or dizziness All other systems reviewed and are otherwise negative except as noted above.    Blood pressure 110/72, pulse 82, height 5' 3"  (1.6 m), weight 145 lb (65.772 kg), last menstrual period 05/20/2013.  General appearance: alert and no distress Neck: no adenopathy, no carotid bruit, no JVD, supple, symmetrical, trachea midline and thyroid not enlarged, symmetric, no tenderness/mass/nodules Lungs: clear to auscultation bilaterally Heart: regular rate and rhythm, S1, S2 normal, no murmur, click, rub or gallop Extremities: extremities normal, atraumatic, no cyanosis or edema  EKG normal sinus rhythm at 82 with no ST or T wave changes  ASSESSMENT AND PLAN:   Palpitations Patient is a 38 year old married Caucasian female mother of one 69-year-old son named Anna Berger. She is currently [redacted] weeks pregnant. She was referred by her OB/GYN because of new onset palpitations. She had no symptoms to her for this pregnancy. She does admit to caffeine intake as well as using stimulant such as Claritin-D. The palpitations began at about 15 weeks with her daily. She has had no  episodes of presyncope. She denies chest pain or shortness of breath. Her cardiac risk factor profile is remarkable for family history of heart disease with a father who had his first myocardial function in his 57s. She also has hyperlipidemia on statin therapy which is no longer on currently since she is pregnant. We have talked about hormonal and volume shifts during pregnancy. At this point I do not feel compelled to order an echocardiogram ordered a monitor. I will see her back in 4 months, after she is given birth to reassess      Lorretta Harp MD [facility] 12/10/2013 12:10 PM

## 2014-01-13 ENCOUNTER — Encounter (HOSPITAL_COMMUNITY): Payer: Self-pay | Admitting: *Deleted

## 2014-01-13 ENCOUNTER — Inpatient Hospital Stay (HOSPITAL_COMMUNITY)
Admission: AD | Admit: 2014-01-13 | Discharge: 2014-01-13 | Disposition: A | Discharge status: Home / Self Care | Payer: 59 | Source: Ambulatory Visit | Attending: Obstetrics and Gynecology | Admitting: Obstetrics and Gynecology

## 2014-01-13 DIAGNOSIS — K219 Gastro-esophageal reflux disease without esophagitis: Secondary | ICD-10-CM | POA: Insufficient documentation

## 2014-01-13 DIAGNOSIS — O212 Late vomiting of pregnancy: Secondary | ICD-10-CM | POA: Diagnosis present

## 2014-01-13 DIAGNOSIS — O4700 False labor before 37 completed weeks of gestation, unspecified trimester: Secondary | ICD-10-CM | POA: Diagnosis not present

## 2014-01-13 DIAGNOSIS — E785 Hyperlipidemia, unspecified: Secondary | ICD-10-CM | POA: Insufficient documentation

## 2014-01-13 DIAGNOSIS — R197 Diarrhea, unspecified: Secondary | ICD-10-CM | POA: Insufficient documentation

## 2014-01-13 DIAGNOSIS — R002 Palpitations: Secondary | ICD-10-CM | POA: Insufficient documentation

## 2014-01-13 LAB — URINE MICROSCOPIC-ADD ON

## 2014-01-13 LAB — COMPREHENSIVE METABOLIC PANEL
ALT: 15 U/L (ref 0–35)
ANION GAP: 11 (ref 5–15)
AST: 20 U/L (ref 0–37)
Albumin: 2.5 g/dL — ABNORMAL LOW (ref 3.5–5.2)
Alkaline Phosphatase: 97 U/L (ref 39–117)
BILIRUBIN TOTAL: 0.3 mg/dL (ref 0.3–1.2)
BUN: 12 mg/dL (ref 6–23)
CALCIUM: 8.8 mg/dL (ref 8.4–10.5)
CHLORIDE: 106 meq/L (ref 96–112)
CO2: 20 mEq/L (ref 19–32)
Creatinine, Ser: 0.5 mg/dL (ref 0.50–1.10)
GFR calc Af Amer: >90 mL/min (ref >90)
GFR calc non Af Amer: >90 mL/min (ref >90)
Glucose, Bld: 70 mg/dL (ref 70–99)
Potassium: 4 mEq/L (ref 3.7–5.3)
Sodium: 137 mEq/L (ref 137–147)
Total Protein: 6.2 g/dL (ref 6.0–8.3)

## 2014-01-13 LAB — CBC WITH DIFFERENTIAL/PLATELET
BASOS PCT: 0 % (ref 0–1)
Basophils Absolute: 0 10*3/uL (ref 0.0–0.1)
EOS ABS: 0.3 10*3/uL (ref 0.0–0.7)
Eosinophils Relative: 2 % (ref 0–5)
HCT: 39.9 % (ref 36.0–46.0)
HEMOGLOBIN: 13.3 g/dL (ref 12.0–15.0)
LYMPHS ABS: 0.9 10*3/uL (ref 0.7–4.0)
Lymphocytes Relative: 6 % — ABNORMAL LOW (ref 12–46)
MCH: 32.8 pg (ref 26.0–34.0)
MCHC: 33.3 g/dL (ref 30.0–36.0)
MCV: 98.5 fL (ref 78.0–100.0)
MONO ABS: 0.8 10*3/uL (ref 0.1–1.0)
MONOS PCT: 6 % (ref 3–12)
Neutro Abs: 12.3 10*3/uL — ABNORMAL HIGH (ref 1.7–7.7)
Neutrophils Relative %: 86 % — ABNORMAL HIGH (ref 43–77)
Platelets: 256 10*3/uL (ref 150–400)
RBC: 4.05 MIL/uL (ref 3.87–5.11)
RDW: 14 % (ref 11.5–15.5)
WBC: 14.3 10*3/uL — ABNORMAL HIGH (ref 4.0–10.5)

## 2014-01-13 LAB — URINALYSIS, ROUTINE W REFLEX MICROSCOPIC
BILIRUBIN URINE: NEGATIVE
Glucose, UA: NEGATIVE mg/dL
Ketones, ur: 40 mg/dL — AB
Leukocytes, UA: NEGATIVE
Nitrite: NEGATIVE
Protein, ur: NEGATIVE mg/dL
Specific Gravity, Urine: 1.02 (ref 1.005–1.030)
UROBILINOGEN UA: 0.2 mg/dL (ref 0.0–1.0)
pH: 6 (ref 5.0–8.0)

## 2014-01-13 LAB — SAMPLE TO BLOOD BANK

## 2014-01-13 MED ORDER — LACTATED RINGERS IV BOLUS (SEPSIS)
1000.0000 mL | Freq: Once | INTRAVENOUS | Status: AC
  Administered 2014-01-13: 1000 mL via INTRAVENOUS

## 2014-01-13 MED ORDER — TERBUTALINE SULFATE 1 MG/ML IJ SOLN
0.2500 mg | Freq: Once | INTRAMUSCULAR | Status: AC
  Administered 2014-01-13: 0.25 mg via SUBCUTANEOUS
  Filled 2014-01-13: qty 1

## 2014-01-13 MED ORDER — ONDANSETRON 4 MG PO TBDP
4.0000 mg | ORAL_TABLET | Freq: Once | ORAL | Status: AC
  Administered 2014-01-13: 4 mg via ORAL
  Filled 2014-01-13: qty 1

## 2014-01-13 MED ORDER — LACTATED RINGERS IV BOLUS (SEPSIS)
500.0000 mL | Freq: Once | INTRAVENOUS | Status: AC
Start: 2014-01-13 — End: 2014-01-13
  Administered 2014-01-13: 12:00:00 via INTRAVENOUS

## 2014-01-13 MED ORDER — NIFEDIPINE 10 MG PO CAPS
10.0000 mg | ORAL_CAPSULE | Freq: Once | ORAL | Status: AC
  Administered 2014-01-13: 10 mg via ORAL
  Filled 2014-01-13: qty 1

## 2014-01-13 NOTE — MAU Provider Note (Signed)
History   Continuation of V. Standard, CNM note/assessment.  Patient reports that contractions have diminished since injection of terbutaline.  Reports active fetus and denies LOF and VB. Reports zofran has resolved nausea and states she has a rx at home. Reports watery stools x 2 days and inability to stay adequately hydrated.    Patient Active Problem List   Diagnosis Date Noted  . Palpitations 12/10/2013  . Hyperlipidemia 12/10/2013  . Family history of heart disease 12/10/2013  . GERD (gastroesophageal reflux disease) 08/28/2013  . UC (ulcerative colitis) 08/16/2011  . Anemia due to blood loss, chronic 08/16/2011  . 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 Factor 5 Leiden mutation, heterozygous 08/16/2011  . 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 Family history of malignant neoplasm of gastrointestinal tract 08/16/2011  . Bilateral ovarian cysts 08/16/2011    Chief Complaint  Patient presents with  . Contractions  . Diarrhea   HPI  OB History   Grav Para Term Preterm Abortions TAB SAB Ect Mult Living   2 1 1  0 0 0 0 0 0 1      Past Medical History  Diagnosis Date  . Hyperlipemia   . Endometritis   . Iron deficiency anemia   . Factor V deficiency   . Unspecified vitamin D deficiency   . Insomnia, unspecified   . Family history of malignant neoplasm of gastrointestinal tract   . Vitamin B12 deficiency   . UC (ulcerative colitis)   . Clotting disorder   . GERD (gastroesophageal reflux disease)   . Rapid palpitations   . Hyperlipidemia   . Family history of coronary artery disease     Past Surgical History  Procedure Laterality Date  . Cesarean section  02/2006  . Ovarian cyst removal  2012  . Colonoscopy    . Polypectomy      Family History  Problem Relation Age of Onset  . Hypertension Father   . Coronary artery disease Father   . Heart attack Father 52  . Hyperlipidemia Father   . Hyperlipidemia Brother   . Crohn's  disease Maternal Uncle   . Colon cancer Maternal Uncle   . Colon polyps Paternal Aunt   . Diabetes Maternal Uncle     History  Substance Use Topics  . Smoking status: Never Smoker   . Smokeless tobacco: Never Used  . Alcohol Use: No     Comment: social    Allergies: No Known Allergies  Prescriptions prior to admission  Medication Sig Dispense Refill  . aspirin 81 MG tablet Take 81 mg by mouth daily.      . cyanocobalamin (,VITAMIN B-12,) 1000 MCG/ML injection INJECT 1 ML INTO A MUSCLE ONCE A WEEK FOR 3 WEEKS,THEN ONCE A MONTH  10 mL  1  . doxylamine, Sleep, (UNISOM) 25 MG tablet Take 25 mg by mouth at bedtime as needed for sleep.       Marland Kitchen LIALDA 1.2 G EC tablet TAKE 1 TABLET BY MOUTH EVERY MORNING  30 tablet  5  . ondansetron (ZOFRAN-ODT) 4 MG disintegrating tablet Take 4 mg by mouth daily as needed for nausea or vomiting.      . Prenatal Vit-Fe Fumarate-FA (PRENATAL MULTIVITAMIN) TABS tablet Take 1 tablet by mouth daily at 12 noon.      . ranitidine (ZANTAC) 150 MG capsule Take 1 capsule (150 mg total) by mouth 2 (two) times daily.  60 capsule  6  . zolpidem (AMBIEN) 5 MG tablet Take 5  mg by mouth at bedtime as needed for sleep.        ROS  See HPI Above Physical Exam   Blood pressure 102/64, pulse 107, temperature 97.9 F (36.6 C), temperature source Oral, resp. rate 18, last menstrual period 05/20/2013. Results for orders placed during the hospital encounter of 01/13/14 (from the past 24 hour(s))  URINALYSIS, ROUTINE W REFLEX MICROSCOPIC     Status: Abnormal   Collection Time    01/13/14 11:00 AM      Result Value Ref Range   Color, Urine YELLOW  YELLOW   APPearance CLEAR  CLEAR   Specific Gravity, Urine 1.020  1.005 - 1.030   pH 6.0  5.0 - 8.0   Glucose, UA NEGATIVE  NEGATIVE mg/dL   Hgb urine dipstick TRACE (*) NEGATIVE   Bilirubin Urine NEGATIVE  NEGATIVE   Ketones, ur 40 (*) NEGATIVE mg/dL   Protein, ur NEGATIVE  NEGATIVE mg/dL   Urobilinogen, UA 0.2  0.0 - 1.0  mg/dL   Nitrite NEGATIVE  NEGATIVE   Leukocytes, UA NEGATIVE  NEGATIVE  URINE MICROSCOPIC-ADD ON     Status: None   Collection Time    01/13/14 11:00 AM      Result Value Ref Range   Squamous Epithelial / LPF RARE  RARE   WBC, UA 0-2  <3 WBC/hpf  CBC WITH DIFFERENTIAL     Status: Abnormal   Collection Time    01/13/14 12:05 PM      Result Value Ref Range   WBC 14.3 (*) 4.0 - 10.5 K/uL   RBC 4.05  3.87 - 5.11 MIL/uL   Hemoglobin 13.3  12.0 - 15.0 g/dL   HCT 39.9  36.0 - 46.0 %   MCV 98.5  78.0 - 100.0 fL   MCH 32.8  26.0 - 34.0 pg   MCHC 33.3  30.0 - 36.0 g/dL   RDW 14.0  11.5 - 15.5 %   Platelets 256  150 - 400 K/uL   Neutrophils Relative % 86 (*) 43 - 77 %   Neutro Abs 12.3 (*) 1.7 - 7.7 K/uL   Lymphocytes Relative 6 (*) 12 - 46 %   Lymphs Abs 0.9  0.7 - 4.0 K/uL   Monocytes Relative 6  3 - 12 %   Monocytes Absolute 0.8  0.1 - 1.0 K/uL   Eosinophils Relative 2  0 - 5 %   Eosinophils Absolute 0.3  0.0 - 0.7 K/uL   Basophils Relative 0  0 - 1 %   Basophils Absolute 0.0  0.0 - 0.1 K/uL  COMPREHENSIVE METABOLIC PANEL     Status: Abnormal   Collection Time    01/13/14 12:05 PM      Result Value Ref Range   Sodium 137  137 - 147 mEq/L   Potassium 4.0  3.7 - 5.3 mEq/L   Chloride 106  96 - 112 mEq/L   CO2 20  19 - 32 mEq/L   Glucose, Bld 70  70 - 99 mg/dL   BUN 12  6 - 23 mg/dL   Creatinine, Ser 0.50  0.50 - 1.10 mg/dL   Calcium 8.8  8.4 - 10.5 mg/dL   Total Protein 6.2  6.0 - 8.3 g/dL   Albumin 2.5 (*) 3.5 - 5.2 g/dL   AST 20  0 - 37 U/L   ALT 15  0 - 35 U/L   Alkaline Phosphatase 97  39 - 117 U/L   Total Bilirubin 0.3  0.3 -  1.2 mg/dL   GFR calc non Af Amer >90  >90 mL/min   GFR calc Af Amer >90  >90 mL/min   Anion gap 11  5 - 15  SAMPLE TO BLOOD BANK     Status: None   Collection Time    01/13/14 12:05 PM      Result Value Ref Range   Blood Bank Specimen SAMPLE AVAILABLE FOR TESTING     Sample Expiration 01/16/2014      Physical Exam  Constitutional: She is  oriented to person, place, and time. She appears distressed.  Cardiovascular: Normal rate.   Respiratory: Effort normal.  GI: Soft. There is no tenderness.  Appears gravid--fundal height appropriate for GA   Neurological: She is alert and oriented to person, place, and time.  Skin: Skin is warm and dry.   FHR: 150 bpm, Mod Var, -Decels, +Accels UC: None graphed or palpated  ED Course  Assessment: IUP at 34wks Cat I FT Contractions Diarrhea  Plan: -Discussed BRAT Diet -Okay to take imodium, sparingly, for diarrhea -Instructed to increase fluids and report status of contractions and diarrhea at next visit. -Keep appt as scheduled: 01/17/2014 -Call if you have any questions or concerns prior to your next visit.  -Bleeding and Labor Precautions Reviewed -Discharged to home in stable condition  Mikle Sternberg LYNN CNM, MSN 01/13/2014 7:55 PM

## 2014-01-13 NOTE — MAU Note (Signed)
Venus Standard CNM notified of pts continued nausea.  Order rec'd.

## 2014-01-13 NOTE — MAU Note (Signed)
Pt states she began having diarrhea @ 0500 this a.m., went numerous times, started having uc's around 0730, denies bleeding or LOF.  No vomiting, states she is still having diarrhea.

## 2014-01-13 NOTE — Discharge Instructions (Signed)
Preterm Labor Information Preterm labor is when labor starts at less than 37 weeks of pregnancy. The normal length of a pregnancy is 39 to 41 weeks. CAUSES Often, there is no identifiable underlying cause as to why a woman goes into preterm labor. One of the most common known causes of preterm labor is infection. Infections of the uterus, cervix, vagina, amniotic sac, bladder, kidney, or even the lungs (pneumonia) can cause labor to start. Other suspected causes of preterm labor include:   Urogenital infections, such as yeast infections and bacterial vaginosis.   Uterine abnormalities (uterine shape, uterine septum, fibroids, or bleeding from the placenta).   A cervix that has been operated on (it may fail to stay closed).   Malformations in the fetus.   Multiple gestations (twins, triplets, and so on).   Breakage of the amniotic sac.  RISK FACTORS  Having a previous history of preterm labor.   Having premature rupture of membranes (PROM).   Having a placenta that covers the opening of the cervix (placenta previa).   Having a placenta that separates from the uterus (placental abruption).   Having a cervix that is too weak to hold the fetus in the uterus (incompetent cervix).   Having too much fluid in the amniotic sac (polyhydramnios).   Taking illegal drugs or smoking while pregnant.   Not gaining enough weight while pregnant.   Being younger than 36 and older than 39 years old.   Having a low socioeconomic status.   Being African American. SYMPTOMS Signs and symptoms of preterm labor include:   Menstrual-like cramps, abdominal pain, or back pain.  Uterine contractions that are regular, as frequent as six in an hour, regardless of their intensity (may be mild or painful).  Contractions that start on the top of the uterus and spread down to the lower abdomen and back.   A sense of increased pelvic pressure.   A watery or bloody mucus discharge that  comes from the vagina.  TREATMENT Depending on the length of the pregnancy and other circumstances, your health care provider may suggest bed rest. If necessary, there are medicines that can be given to stop contractions and to mature the fetal lungs. If labor happens before 34 weeks of pregnancy, a prolonged hospital stay may be recommended. Treatment depends on the condition of both you and the fetus.  WHAT SHOULD YOU DO IF YOU THINK YOU ARE IN PRETERM LABOR? Call your health care provider right away. You will need to go to the hospital to get checked immediately. HOW CAN YOU PREVENT PRETERM LABOR IN FUTURE PREGNANCIES? You should:   Stop smoking if you smoke.  Maintain healthy weight gain and avoid chemicals and drugs that are not necessary.  Be watchful for any type of infection.  Inform your health care provider if you have a known history of preterm labor. Document Released: 08/13/2003 Document Revised: 01/23/2013 Document Reviewed: 06/25/2012 [facility] Patient Information 2015 [place], [place]. This information is not intended to replace advice given to you by your health care provider. Make sure you discuss any questions you have with your health care provider.

## 2014-01-13 NOTE — MAU Provider Note (Signed)
Anna Berger is a 38 y.o. G2P1001 at 34.0 weeks SP CSpresent to MAU with nausea and diarrhea since this morning followed by mild ctx.  She denies vb or lof w/+FM. She report taking Zofran this morning at 0600.   History     Patient Active Problem List   Diagnosis Date Noted  . Palpitations 12/10/2013  . Hyperlipidemia 12/10/2013  . Family history of heart disease 12/10/2013  . GERD (gastroesophageal reflux disease) 08/28/2013  . UC (ulcerative colitis) 08/16/2011  . Anemia due to blood loss, chronic 08/16/2011  . 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 Factor 5 Leiden mutation, heterozygous 08/16/2011  . 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 Family history of malignant neoplasm of gastrointestinal tract 08/16/2011  . Bilateral ovarian cysts 08/16/2011    Chief Complaint  Patient presents with  . Contractions  . Diarrhea   HPI  OB History   Grav Para Term Preterm Abortions TAB SAB Ect Mult Living   2 1 1  0 0 0 0 0 0 1      Past Medical History  Diagnosis Date  . Hyperlipemia   . Endometritis   . Iron deficiency anemia   . Factor V deficiency   . Unspecified vitamin D deficiency   . Insomnia, unspecified   . Family history of malignant neoplasm of gastrointestinal tract   . Vitamin B12 deficiency   . UC (ulcerative colitis)   . Clotting disorder   . GERD (gastroesophageal reflux disease)   . Rapid palpitations   . Hyperlipidemia   . Family history of coronary artery disease     Past Surgical History  Procedure Laterality Date  . Cesarean section  02/2006  . Ovarian cyst removal  2012  . Colonoscopy    . Polypectomy      Family History  Problem Relation Age of Onset  . Hypertension Father   . Coronary artery disease Father   . Heart attack Father 60  . Hyperlipidemia Father   . Hyperlipidemia Brother   . Crohn's disease Maternal Uncle   . Colon cancer Maternal Uncle   . Colon polyps Paternal Aunt   . Diabetes  Maternal Uncle     History  Substance Use Topics  . Smoking status: Never Smoker   . Smokeless tobacco: Never Used  . Alcohol Use: No     Comment: social    Allergies: No Known Allergies  Prescriptions prior to admission  Medication Sig Dispense Refill  . aspirin 81 MG tablet Take 81 mg by mouth daily.      . cyanocobalamin (,VITAMIN B-12,) 1000 MCG/ML injection INJECT 1 ML INTO A MUSCLE ONCE A WEEK FOR 3 WEEKS,THEN ONCE A MONTH  10 mL  1  . doxylamine, Sleep, (UNISOM) 25 MG tablet Take 25 mg by mouth at bedtime as needed for sleep.       Marland Kitchen LIALDA 1.2 G EC tablet TAKE 1 TABLET BY MOUTH EVERY MORNING  30 tablet  5  . ondansetron (ZOFRAN-ODT) 4 MG disintegrating tablet Take 4 mg by mouth daily as needed for nausea or vomiting.      . Prenatal Vit-Fe Fumarate-FA (PRENATAL MULTIVITAMIN) TABS tablet Take 1 tablet by mouth daily at 12 noon.      . ranitidine (ZANTAC) 150 MG capsule Take 1 capsule (150 mg total) by mouth 2 (two) times daily.  60 capsule  6  . zolpidem (AMBIEN) 5 MG tablet Take 5 mg by mouth at bedtime as needed for sleep.  ROS See HPI above, all other systems are negative  Physical Exam   Blood pressure 120/77, pulse 101, temperature 97.9 F (36.6 C), temperature source Oral, resp. rate 18, last menstrual period 05/20/2013.  Physical Exam  FHR, Category 2, rate 150, min-mod variability, accel, variable decel x1, ctx 3-4 mild ABD: Soft, non tender to palpation, no rebound or guarding SVE: deferred   ED Course  Assessment: IUP at  34.0weeks Membranes: intact FHR: Category  CTX:  3-4  minutes   Plan: IB bolus, then 125 Labs: CBC, CMP, UA,  Consult with Dr. Darci Current Michaele Amundson, CNM, MSN 01/13/2014. 11:34 AM

## 2014-01-13 NOTE — MAU Note (Signed)
Pt returned from bathroom, monitors reapplied.

## 2014-01-13 NOTE — MAU Provider Note (Signed)
Results for orders placed during the hospital encounter of 01/13/14 (from the past 24 hour(s))  URINALYSIS, ROUTINE W REFLEX MICROSCOPIC     Status: Abnormal   Collection Time    01/13/14 11:00 AM      Result Value Ref Range   Color, Urine YELLOW  YELLOW   APPearance CLEAR  CLEAR   Specific Gravity, Urine 1.020  1.005 - 1.030   pH 6.0  5.0 - 8.0   Glucose, UA NEGATIVE  NEGATIVE mg/dL   Hgb urine dipstick TRACE (*) NEGATIVE   Bilirubin Urine NEGATIVE  NEGATIVE   Ketones, ur 40 (*) NEGATIVE mg/dL   Protein, ur NEGATIVE  NEGATIVE mg/dL   Urobilinogen, UA 0.2  0.0 - 1.0 mg/dL   Nitrite NEGATIVE  NEGATIVE   Leukocytes, UA NEGATIVE  NEGATIVE  URINE MICROSCOPIC-ADD ON     Status: None   Collection Time    01/13/14 11:00 AM      Result Value Ref Range   Squamous Epithelial / LPF RARE  RARE   WBC, UA 0-2  <3 WBC/hpf  CBC WITH DIFFERENTIAL     Status: Abnormal   Collection Time    01/13/14 12:05 PM      Result Value Ref Range   WBC 14.3 (*) 4.0 - 10.5 K/uL   RBC 4.05  3.87 - 5.11 MIL/uL   Hemoglobin 13.3  12.0 - 15.0 g/dL   HCT 39.9  36.0 - 46.0 %   MCV 98.5  78.0 - 100.0 fL   MCH 32.8  26.0 - 34.0 pg   MCHC 33.3  30.0 - 36.0 g/dL   RDW 14.0  11.5 - 15.5 %   Platelets 256  150 - 400 K/uL   Neutrophils Relative % 86 (*) 43 - 77 %   Neutro Abs 12.3 (*) 1.7 - 7.7 K/uL   Lymphocytes Relative 6 (*) 12 - 46 %   Lymphs Abs 0.9  0.7 - 4.0 K/uL   Monocytes Relative 6  3 - 12 %   Monocytes Absolute 0.8  0.1 - 1.0 K/uL   Eosinophils Relative 2  0 - 5 %   Eosinophils Absolute 0.3  0.0 - 0.7 K/uL   Basophils Relative 0  0 - 1 %   Basophils Absolute 0.0  0.0 - 0.1 K/uL  COMPREHENSIVE METABOLIC PANEL     Status: Abnormal   Collection Time    01/13/14 12:05 PM      Result Value Ref Range   Sodium 137  137 - 147 mEq/L   Potassium 4.0  3.7 - 5.3 mEq/L   Chloride 106  96 - 112 mEq/L   CO2 20  19 - 32 mEq/L   Glucose, Bld 70  70 - 99 mg/dL   BUN 12  6 - 23 mg/dL   Creatinine, Ser 0.50  0.50  - 1.10 mg/dL   Calcium 8.8  8.4 - 10.5 mg/dL   Total Protein 6.2  6.0 - 8.3 g/dL   Albumin 2.5 (*) 3.5 - 5.2 g/dL   AST 20  0 - 37 U/L   ALT 15  0 - 35 U/L   Alkaline Phosphatase 97  39 - 117 U/L   Total Bilirubin 0.3  0.3 - 1.2 mg/dL   GFR calc non Af Amer >90  >90 mL/min   GFR calc Af Amer >90  >90 mL/min   Anion gap 11  5 - 15  SAMPLE TO BLOOD BANK     Status: None  Collection Time    01/13/14 12:05 PM      Result Value Ref Range   Blood Bank Specimen SAMPLE AVAILABLE FOR TESTING     Sample Expiration 01/16/2014      Filed Vitals:   01/13/14 1119 01/13/14 1459 01/13/14 1714  BP: 120/77 105/63 99/48  Pulse: 101 100 107  Temp: 97.9 F (36.6 C)    TempSrc: Oral    Resp: 18 18     Addendum Procardia given x1 at 1443 .  Pt report it did not help.  Consulted with Dr. Cletis Media FHR Category 1 CTX  Currently sq2-4 Cervix C/T/H  Current BP low, hold next dose of procardia  Give terbutaline 0.36m  Re- eval in 30 minutes       Alyra Patty, CNM, MSN 01/13/2014. 5:36 PM

## 2014-01-14 LAB — URINE CULTURE

## 2014-01-15 ENCOUNTER — Encounter: Payer: Self-pay | Admitting: Obstetrics and Gynecology

## 2014-01-15 ENCOUNTER — Inpatient Hospital Stay (HOSPITAL_COMMUNITY)
Admission: AD | Admit: 2014-01-15 | Discharge: 2014-01-15 | Disposition: A | Discharge status: Home / Self Care | Payer: 59 | Source: Ambulatory Visit | Attending: Obstetrics & Gynecology | Admitting: Obstetrics & Gynecology

## 2014-01-15 ENCOUNTER — Encounter (HOSPITAL_COMMUNITY): Payer: Self-pay | Admitting: *Deleted

## 2014-01-15 DIAGNOSIS — R002 Palpitations: Secondary | ICD-10-CM | POA: Insufficient documentation

## 2014-01-15 DIAGNOSIS — K219 Gastro-esophageal reflux disease without esophagitis: Secondary | ICD-10-CM | POA: Insufficient documentation

## 2014-01-15 DIAGNOSIS — O34219 Maternal care for unspecified type scar from previous cesarean delivery: Secondary | ICD-10-CM | POA: Insufficient documentation

## 2014-01-15 DIAGNOSIS — O4703 False labor before 37 completed weeks of gestation, third trimester: Secondary | ICD-10-CM

## 2014-01-15 DIAGNOSIS — O4700 False labor before 37 completed weeks of gestation, unspecified trimester: Secondary | ICD-10-CM | POA: Insufficient documentation

## 2014-01-15 LAB — URINALYSIS, ROUTINE W REFLEX MICROSCOPIC
Bilirubin Urine: NEGATIVE
GLUCOSE, UA: NEGATIVE mg/dL
Ketones, ur: 15 mg/dL — AB
LEUKOCYTES UA: NEGATIVE
NITRITE: NEGATIVE
PH: 6 (ref 5.0–8.0)
Protein, ur: NEGATIVE mg/dL
Urobilinogen, UA: 1 mg/dL (ref 0.0–1.0)

## 2014-01-15 LAB — URINE MICROSCOPIC-ADD ON

## 2014-01-15 LAB — WET PREP, GENITAL
CLUE CELLS WET PREP: NONE SEEN
Trich, Wet Prep: NONE SEEN
Yeast Wet Prep HPF POC: NONE SEEN

## 2014-01-15 MED ORDER — NIFEDIPINE 10 MG PO CAPS: 10.0000 mg | ORAL_CAPSULE | Freq: Four times a day (QID) | ORAL | Status: DC

## 2014-01-15 MED ORDER — NIFEDIPINE 10 MG PO CAPS
10.0000 mg | ORAL_CAPSULE | Freq: Once | ORAL | Status: AC
  Administered 2014-01-15: 10 mg via ORAL
  Filled 2014-01-15: qty 1

## 2014-01-15 MED ORDER — TERBUTALINE SULFATE 1 MG/ML IJ SOLN
0.2500 mg | Freq: Once | INTRAMUSCULAR | Status: AC
  Administered 2014-01-15: 0.25 mg via SUBCUTANEOUS
  Filled 2014-01-15: qty 1

## 2014-01-15 NOTE — MAU Provider Note (Signed)
History   38 yo G2P1001 at 34 2/7 weeks presented after calling office with UCs q 3-4 x 2 hours. Denies leaking or bleeding, reports +FM.  Seen 8/10 in MAU for nausea and diarrhea--received Procardia, which caused a drop in BP, but no sx, and then Terbutaline x 2 for contractions with resolution.  Cervix closed by patient report.  Scheduled for repeat C/S on 11/17/13.   Patient Active Problem List   Diagnosis Date Noted  . Previous cesarean delivery, antepartum condition or complication 02/63/7858  . Palpitations 12/10/2013  . Hyperlipidemia 12/10/2013  . Family history of heart disease 12/10/2013  . GERD (gastroesophageal reflux disease) 08/28/2013  . UC (ulcerative colitis) 08/16/2011  . Anemia due to blood loss, chronic 08/16/2011  . 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 Factor 5 Leiden mutation, heterozygous 08/16/2011  . 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 Family history of malignant neoplasm of gastrointestinal tract 08/16/2011  . Bilateral ovarian cysts 08/16/2011    Chief Complaint  Patient presents with  . Contractions   HPI:  As above  OB History   Grav Para Term Preterm Abortions TAB SAB Ect Mult Living   2 1 1  0 0 0 0 0 0 1      Past Medical History  Diagnosis Date  . Hyperlipemia   . Endometritis   . Iron deficiency anemia   . Factor V deficiency   . Unspecified vitamin D deficiency   . Insomnia, unspecified   . Family history of malignant neoplasm of gastrointestinal tract   . Vitamin B12 deficiency   . UC (ulcerative colitis)   . Clotting disorder   . GERD (gastroesophageal reflux disease)   . Rapid palpitations   . Hyperlipidemia   . Family history of coronary artery disease     Past Surgical History  Procedure Laterality Date  . Cesarean section  02/2006  . Ovarian cyst removal  2012  . Colonoscopy    . Polypectomy      Family History  Problem Relation Age of Onset  . Hypertension Father   .  Coronary artery disease Father   . Heart attack Father 24  . Hyperlipidemia Father   . Hyperlipidemia Brother   . Crohn's disease Maternal Uncle   . Colon cancer Maternal Uncle   . Colon polyps Paternal Aunt   . Diabetes Maternal Uncle     History  Substance Use Topics  . Smoking status: Never Smoker   . Smokeless tobacco: Never Used  . Alcohol Use: No     Comment: social    Allergies: No Known Allergies  Prescriptions prior to admission  Medication Sig Dispense Refill  . aspirin 81 MG tablet Take 81 mg by mouth daily.      . cyanocobalamin (,VITAMIN B-12,) 1000 MCG/ML injection INJECT 1 ML INTO A MUSCLE ONCE A WEEK FOR 3 WEEKS,THEN ONCE A MONTH  10 mL  1  . doxylamine, Sleep, (UNISOM) 25 MG tablet Take 25 mg by mouth at bedtime as needed for sleep.       Marland Kitchen LIALDA 1.2 G EC tablet TAKE 1 TABLET BY MOUTH EVERY MORNING  30 tablet  5  . ondansetron (ZOFRAN-ODT) 4 MG disintegrating tablet Take 4 mg by mouth daily as needed for nausea or vomiting.      . Prenatal Vit-Fe Fumarate-FA (PRENATAL MULTIVITAMIN) TABS tablet Take 1 tablet by mouth daily at 12 noon.      . ranitidine (ZANTAC) 150 MG capsule Take 1 capsule (  150 mg total) by mouth 2 (two) times daily.  60 capsule  6  . zolpidem (AMBIEN) 5 MG tablet Take 5 mg by mouth at bedtime as needed for sleep.        ROS:  Cramping, +FM Physical Exam   Blood pressure 132/66, pulse 90, temperature 98 F (36.7 C), temperature source Oral, resp. rate 18, last menstrual period 05/20/2013.  Physical Exam Aware of painful UCs at intervals. Chest clear Heart RRR without murmur Abd gravid, NT Pelvic--small amount white d/c, cervix closed, anterior, firm, thick, vtx, -2 Ext WNL  FHR  Segment of Category 1, with negative spontaneous CST.  Occasional very quick/shallow variables. UCs q 4 min, mild/moderate  ED Course  Assessment: IUP at 34 2/7 weeks Previous C/S, repeat planned on 02/17/14 Factor V Leiden Ulcerative colitis PT  UCs  Plan: UA Push po fluids Wet prep GC, chlamdia Procardia 10 mg x 1 dose   Donnel Saxon CNM, MSN 01/15/2014 4:52 PM  Addendum: Received Procardia 10 mg at 1734 without benefit, but no sx either Still contracting q 4 min.  Results for orders placed during the hospital encounter of 01/15/14 (from the past 24 hour(s))  URINALYSIS, ROUTINE W REFLEX MICROSCOPIC     Status: Abnormal   Collection Time    01/15/14  4:50 PM      Result Value Ref Range   Color, Urine YELLOW  YELLOW   APPearance CLEAR  CLEAR   Specific Gravity, Urine <1.005 (*) 1.005 - 1.030   pH 6.0  5.0 - 8.0   Glucose, UA NEGATIVE  NEGATIVE mg/dL   Hgb urine dipstick TRACE (*) NEGATIVE   Bilirubin Urine NEGATIVE  NEGATIVE   Ketones, ur 15 (*) NEGATIVE mg/dL   Protein, ur NEGATIVE  NEGATIVE mg/dL   Urobilinogen, UA 1.0  0.0 - 1.0 mg/dL   Nitrite NEGATIVE  NEGATIVE   Leukocytes, UA NEGATIVE  NEGATIVE  URINE MICROSCOPIC-ADD ON     Status: Abnormal   Collection Time    01/15/14  4:50 PM      Result Value Ref Range   Squamous Epithelial / LPF FEW (*) RARE   WBC, UA 0-2  <3 WBC/hpf   RBC / HPF 0-2  <3 RBC/hpf   Bacteria, UA FEW (*) RARE  WET PREP, GENITAL     Status: Abnormal   Collection Time    01/15/14  5:10 PM      Result Value Ref Range   Yeast Wet Prep HPF POC NONE SEEN  NONE SEEN   Trich, Wet Prep NONE SEEN  NONE SEEN   Clue Cells Wet Prep HPF POC NONE SEEN  NONE SEEN   WBC, Wet Prep HPF POC FEW (*) NONE SEEN   Will give Terbutaline 0.25 mg now and observe response. Continue po hydration. F/u care to be completed by on-coming CNM, Farrel Gordon.  Donnel Saxon, CNM 01/15/14 6:25p

## 2014-01-15 NOTE — MAU Note (Signed)
Was here on Monday for PTL, dc'd without meds.  Had some tightening on Tues.  Started contracting today, now every 3-4 min. No bleeding or leaking.

## 2014-01-15 NOTE — MAU Provider Note (Signed)
History  Received report from V. Cira Servant, CNM and assumed care of pt. Presented to MAU w/ c/o regular ctxs. Reports feeling better s/p terbutaline injection; now only feeling occasional ctx. Denies any other sxs.   Patient Active Problem List   Diagnosis Date Noted  . Previous cesarean delivery, antepartum condition or complication 11/65/7903  . Preterm uterine contractions in third trimester, antepartum 01/15/2014  . Palpitations 12/10/2013  . Hyperlipidemia 12/10/2013  . Family history of heart disease 12/10/2013  . GERD (gastroesophageal reflux disease) 08/28/2013  . UC (ulcerative colitis) 08/16/2011  . Anemia due to blood loss, chronic 08/16/2011  . 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 Factor 5 Leiden mutation, heterozygous 08/16/2011  . 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 Family history of malignant neoplasm of gastrointestinal tract 08/16/2011  . Bilateral ovarian cysts 08/16/2011    Chief Complaint  Patient presents with  . Contractions   HPI See above OB History   Grav Para Term Preterm Abortions TAB SAB Ect Mult Living   2 1 1  0 0 0 0 0 0 1      Past Medical History  Diagnosis Date  . Hyperlipemia   . Endometritis   . Iron deficiency anemia   . Factor V deficiency   . Unspecified vitamin D deficiency   . Insomnia, unspecified   . Family history of malignant neoplasm of gastrointestinal tract   . Vitamin B12 deficiency   . UC (ulcerative colitis)   . Clotting disorder   . GERD (gastroesophageal reflux disease)   . Rapid palpitations   . Hyperlipidemia   . Family history of coronary artery disease     Past Surgical History  Procedure Laterality Date  . Cesarean section  02/2006  . Ovarian cyst removal  2012  . Colonoscopy    . Polypectomy      Family History  Problem Relation Age of Onset  . Hypertension Father   . Coronary artery disease Father   . Heart attack Father 39  . Hyperlipidemia Father   .  Hyperlipidemia Brother   . Crohn's disease Maternal Uncle   . Colon cancer Maternal Uncle   . Colon polyps Paternal Aunt   . Diabetes Maternal Uncle     History  Substance Use Topics  . Smoking status: Never Smoker   . Smokeless tobacco: Never Used  . Alcohol Use: No     Comment: social    Allergies: No Known Allergies  No prescriptions prior to admission    ROS Occasional ctx +FM Physical Exam   Blood pressure 120/69, pulse 115, temperature 98 F (36.7 C), temperature source Oral, resp. rate 16, last menstrual period 05/20/2013.  Physical Exam Abdomen: soft, non-tender.  FHRT reassuring.  Occ ctx since Terbutaline injection.  Pelvic: Closed/thick. ED Course  Assessment: PTCs w/o cervical change. Suspect dehydration.  Plan: Was observed in MAU s/p terbutaline injection. Now with occasional ctx and no cervical change. Procardia prn. Rest, to include pelvic. Po hydration. Keep f/u appt in office in 2 days. Strict PTL precautions. Work note.    Farrel Gordon CNM, MS 01/15/2014 9:37 PM

## 2014-01-15 NOTE — Discharge Instructions (Signed)
Braxton Hicks Contractions Contractions of the uterus can occur throughout pregnancy. Contractions are not always a sign that you are in labor.  WHAT ARE BRAXTON HICKS CONTRACTIONS?  Contractions that occur before labor are called Braxton Hicks contractions, or false labor. Toward the end of pregnancy (32-34 weeks), these contractions can develop more often and may become more forceful. This is not true labor because these contractions do not result in opening (dilatation) and thinning of the cervix. They are sometimes difficult to tell apart from true labor because these contractions can be forceful and people have different pain tolerances. You should not feel embarrassed if you go to the hospital with false labor. Sometimes, the only way to tell if you are in true labor is for your health care provider to look for changes in the cervix. If there are no prenatal problems or other health problems associated with the pregnancy, it is completely safe to be sent home with false labor and await the onset of true labor. HOW CAN YOU TELL THE DIFFERENCE BETWEEN TRUE AND FALSE LABOR? False Labor  The contractions of false labor are usually shorter and not as hard as those of true labor.   The contractions are usually irregular.   The contractions are often felt in the front of the lower abdomen and in the groin.   The contractions may go away when you walk around or change positions while lying down.   The contractions get weaker and are shorter lasting as time goes on.   The contractions do not usually become progressively stronger, regular, and closer together as with true labor.  True Labor  Contractions in true labor last 30-70 seconds, become very regular, usually become more intense, and increase in frequency.   The contractions do not go away with walking.   The discomfort is usually felt in the top of the uterus and spreads to the lower abdomen and low back.   True labor can be  determined by your health care provider with an exam. This will show that the cervix is dilating and getting thinner.  WHAT TO REMEMBER  Keep up with your usual exercises and follow other instructions given by your health care provider.   Take medicines as directed by your health care provider.   Keep your regular prenatal appointments.   Eat and drink lightly if you think you are going into labor.   If Braxton Hicks contractions are making you uncomfortable:   Change your position from lying down or resting to walking, or from walking to resting.   Sit and rest in a tub of warm water.   Drink 2-3 glasses of water. Dehydration may cause these contractions.   Do slow and deep breathing several times an hour.  WHEN SHOULD I SEEK IMMEDIATE MEDICAL CARE? Seek immediate medical care if:  Your contractions become stronger, more regular, and closer together.   You have fluid leaking or gushing from your vagina.   You have a fever.   You pass blood-tinged mucus.   You have vaginal bleeding.   You have continuous abdominal pain.   You have low back pain that you never had before.   You feel your baby's head pushing down and causing pelvic pressure.   Your baby is not moving as much as it used to.  Document Released: 05/23/2005 Document Revised: 05/28/2013 Document Reviewed: 03/04/2013 [facility] Patient Information 2015 [place], [place]. This information is not intended to replace advice given to you by your health care  provider. Make sure you discuss any questions you have with your health care provider.

## 2014-01-16 LAB — GC/CHLAMYDIA PROBE AMP
CT Probe RNA: NEGATIVE
GC PROBE AMP APTIMA: NEGATIVE

## 2014-01-19 LAB — CULTURE, BETA STREP (GROUP B ONLY)

## 2014-01-29 ENCOUNTER — Other Ambulatory Visit: Payer: Self-pay | Admitting: Obstetrics and Gynecology

## 2014-02-06 ENCOUNTER — Encounter (HOSPITAL_COMMUNITY): Payer: Self-pay | Admitting: Pharmacist

## 2014-02-13 ENCOUNTER — Encounter (HOSPITAL_COMMUNITY): Payer: Self-pay

## 2014-02-14 ENCOUNTER — Encounter (HOSPITAL_COMMUNITY): Payer: Self-pay

## 2014-02-14 ENCOUNTER — Encounter (HOSPITAL_COMMUNITY)
Admission: RE | Admit: 2014-02-14 | Discharge: 2014-02-14 | Disposition: A | Discharge status: Home / Self Care | Payer: 59 | Source: Ambulatory Visit | Attending: Obstetrics and Gynecology | Admitting: Obstetrics and Gynecology

## 2014-02-14 HISTORY — DX: Pure hypercholesterolemia, unspecified: E78.00

## 2014-02-14 HISTORY — DX: Disease of blood and blood-forming organs, unspecified: D75.9

## 2014-02-14 LAB — CBC
HCT: 37.8 % (ref 36.0–46.0)
Hemoglobin: 12.7 g/dL (ref 12.0–15.0)
MCH: 33.1 pg (ref 26.0–34.0)
MCHC: 33.6 g/dL (ref 30.0–36.0)
MCV: 98.4 fL (ref 78.0–100.0)
PLATELETS: 252 10*3/uL (ref 150–400)
RBC: 3.84 MIL/uL — ABNORMAL LOW (ref 3.87–5.11)
RDW: 14 % (ref 11.5–15.5)
WBC: 15.1 10*3/uL — AB (ref 4.0–10.5)

## 2014-02-14 LAB — TYPE AND SCREEN
ABO/RH(D): A POS
Antibody Screen: NEGATIVE

## 2014-02-14 LAB — ABO/RH: ABO/RH(D): A POS

## 2014-02-14 NOTE — Patient Instructions (Addendum)
Your procedure is scheduled on: 02/17/14  Enter through the Main Entrance at :[place] up desk phone and dial [phone] and inform us of your arrival.  Please call [phone] if you have any problems the morning of surgery.  Remember: Do not eat food or drink liquids, including water, after midnight:Sunday Clear liquids are ok until: 7am on Monday   You may brush your teeth the morning of surgery.  Take these meds the morning of surgery with a sip of water: Zantac  DO NOT wear jewelry, eye make-up, lipstick,body lotion, or dark fingernail polish.  (Polished toes are ok) You may wear deodorant.  If you are to be admitted after surgery, leave suitcase in car until your room has been assigned. Patients discharged on the day of surgery will not be allowed to drive home. Wear loose fitting, comfortable clothes for your ride home.

## 2014-02-15 LAB — SYPHILIS: RPR W/REFLEX TO RPR TITER AND TREPONEMAL ANTIBODIES, TRADITIONAL SCREENING AND DIAGNOSIS ALGORITHM

## 2014-02-15 NOTE — H&P (Signed)
Anna Berger is a 38 y.o. female, G2P1001 at 74 weeks, presenting on 02/17/14 for scheduled repeat C/S and revision of prior scar.  Denies leaking or bleeding, reports +FM.  Patient Active Problem List   Diagnosis Date Noted  . Previous cesarean delivery, antepartum condition or complication 50/53/9767  . Preterm uterine contractions in third trimester, antepartum 01/15/2014  . Palpitations 12/10/2013  . Hyperlipidemia 12/10/2013  . Family history of heart disease 12/10/2013  . GERD (gastroesophageal reflux disease) 08/28/2013  . UC (ulcerative colitis) 08/16/2011  . Anemia due to blood loss, chronic 08/16/2011  . 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 Factor 5 Leiden mutation, heterozygous 08/16/2011  . 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 Family history of malignant neoplasm of gastrointestinal tract 08/16/2011  . Bilateral ovarian cysts 08/16/2011    History of present pregnancy: Patient entered care at 11 2/7 weeks.  Achieved pregnancy on [facility].   EDC of 02/23/14 was established by LMP and Anna Berger at 11 3/7 weeks   Anatomy scan:  09/24/13 at MFM, with normal findings and an loy-lying placenta, single posterior fibroid noted of 4.6 x 4 x 3.9   Additional Anna Berger evaluations:   25 2/7 weeks:  EFW 1+15, cervix 3.28, anterior placenta, no previa, normal fluid. 30 2/7 weeks:  EFW 1689 gm, 3+12, 63%ile, transverse, normal AFI, anterior placenta, cervix closed. 34 4/7 weeks:  EFW 5+9, 53%ile, vtx, AFI 20.68, anterior placenta. Significant prenatal events:  Conceived on Anna Berger.  Desired repeat C/S with scar revision.  Saw MFM for consult 08/02/13 due to Factor V mutation, AMA, ulcerative colitis, with recommendations for SCDs pp, PP Anna Berger 40 mg Anna Berger daily for 6 weeks, f/u with GI MD around 4-6 weeks due to ulcerative colitis, and baby ASA daily during pregnancy.   Had nuchal translucency at MFM (WNL).  Had Panorama test, but did not have adequate specimen for genetic  testing, but had enough for sex determination (female).  Opted not to repeat.  Had anatomy Anna Berger at MFM, with low-lying placenta noted.  Anna Berger for growth done every 4 weeks due to ulcerative colitis, with resolution of LL placenta to anterior placenta.  Single small fibroid noted on Anna Berger.  Noted heart palpitations around 25 weeks, referred to Cardiology.  Holter monitor ordered, but f/u only needed prn. Last evaluation:  02/12/14--declined flu vaccine, cervix closed.    OB History   Grav Para Term Preterm Abortions TAB SAB Ect Mult Living   2 1 1  0 0 0 0 0 0 1    2007--Primary LTCS due to [facility] and [facility]--induced, had pp uterine infection and pelvic blood clots, 40 4/7 weeks, 14 hour labor, 7+7, female, epidural, 2 layer closure.  Past Medical History  Diagnosis Date  . Hyperlipemia   . Endometritis   . Iron deficiency anemia   . Factor V deficiency   . Unspecified vitamin D deficiency   . Insomnia, unspecified   . Family history of malignant neoplasm of gastrointestinal tract   . Vitamin B12 deficiency   . UC (ulcerative colitis)   . Clotting disorder   . GERD (gastroesophageal reflux disease)   . Rapid palpitations   . Hyperlipidemia   . Family history of coronary artery disease   . Blood dyscrasia     Factor V mutation  . Hypercholesteremia    Past Surgical History  Procedure Laterality Date  . Cesarean section  02/2006  . Ovarian cyst removal  2012  . Colonoscopy    . Polypectomy  Family History: family history includes Colon cancer in her maternal uncle; Colon polyps in her paternal aunt; Coronary artery disease in her father; Crohn's disease in her maternal uncle; Diabetes in her maternal uncle; Heart attack (age of onset: 72) in her father; Hyperlipidemia in her brother and father; Hypertension in her father.  Social History:  reports that she has never smoked. She has never used smokeless tobacco. She reports that she does not drink alcohol or use illicit drugs. Patient is Caucasian,  graduated educated, employed as a Software engineer, of the [employer], married to the FOB Pension scheme manager).     Prenatal Transfer Tool  Maternal Diabetes: No Genetic Screening: Normal 1st trimester nuchal Anna Berger, Panorama had inadequate volume to determine genetic risk assessment, but did show female fetus. Maternal Ultrasounds/Referrals: Normal Fetal Ultrasounds or other Referrals:  None Maternal Substance Abuse:  No Significant Maternal Medications:  Meds include: Other: Baby ASA daily for Factor V Significant Maternal Lab Results: Lab values include: Group B Strep negative    ROS:  Occasional contractions, +FM  No Known Allergies     Last menstrual period 05/20/2013.  Chest clear Heart RRR without murmur Abd gravid, NT, FH 38 cm Pelvic: closed on exam 02/12/14, uterus NT Ext: WNL  FHR: 150s on exam in office UCs:  None  Prenatal labs: ABO, Rh: --/--/A POS, A POS (09/11 1515) Antibody: NEG (09/11 1515) Rubella:   Immune RPR: NON REAC (09/11 1515)  HBsAg: Negative (02/02 0000)  HIV: Non-reactive (02/02 0000)  GBS:  Negative 01/15/14 Sickle cell/Hgb electrophoresis:  NA Pap:  WNL 08/07/13 GC:  Negative 07/08/13 Chlamydia:  Negative 07/08/13 Genetic screenings:  Inadequate sample for testing Glucola:  WNL Other:  Hgb 13.3 at NOB, 12.6 11/13/13, 12.3 12/04/13 TSH WNL at 25 weeks.    Assessment/Plan: IUP at 38 6/7 weeks Previous C/S, desires repeat and scar revision GBS negative Factor V Leiden Ulcerative colitis Patient is pediatric nurse practitioner.   Plan: Admit to [facility] per consult with Dr. Mancel Berger Routine [facility] pre-op orders Per previous consult with MFM, plan Anna Berger 40 mg [place] daily x 6 weeks. Plan referral back to GI MD in 4-6 weeks for f/u on ulcerative colitis.  Anna Berger, Anna Berger, [place] 02/15/2014, 4:09 PM  Agree with above. R/B/A reviewed and consent s/w.

## 2014-02-17 ENCOUNTER — Inpatient Hospital Stay (HOSPITAL_COMMUNITY): Payer: 59 | Admitting: Anesthesiology

## 2014-02-17 ENCOUNTER — Encounter (HOSPITAL_COMMUNITY)
Admission: RE | Disposition: A | Discharge status: Home / Self Care | Payer: Self-pay | Source: Ambulatory Visit | Attending: Obstetrics and Gynecology

## 2014-02-17 ENCOUNTER — Inpatient Hospital Stay (HOSPITAL_COMMUNITY)
Admission: RE | Admit: 2014-02-17 | Discharge: 2014-02-19 | DRG: 765 | Disposition: A | Discharge status: Home / Self Care | Payer: 59 | Source: Ambulatory Visit | Attending: Obstetrics and Gynecology | Admitting: Obstetrics and Gynecology

## 2014-02-17 ENCOUNTER — Encounter (HOSPITAL_COMMUNITY): Payer: Self-pay | Admitting: Anesthesiology

## 2014-02-17 ENCOUNTER — Encounter (HOSPITAL_COMMUNITY): Payer: 59 | Admitting: Anesthesiology

## 2014-02-17 DIAGNOSIS — I499 Cardiac arrhythmia, unspecified: Secondary | ICD-10-CM

## 2014-02-17 DIAGNOSIS — Z833 Family history of diabetes mellitus: Secondary | ICD-10-CM | POA: Diagnosis not present

## 2014-02-17 DIAGNOSIS — R002 Palpitations: Secondary | ICD-10-CM | POA: Diagnosis present

## 2014-02-17 DIAGNOSIS — D689 Coagulation defect, unspecified: Secondary | ICD-10-CM | POA: Diagnosis present

## 2014-02-17 DIAGNOSIS — I498 Other specified cardiac arrhythmias: Secondary | ICD-10-CM | POA: Diagnosis not present

## 2014-02-17 DIAGNOSIS — Z800 Family history of malignant neoplasm of digestive organs: Secondary | ICD-10-CM | POA: Diagnosis not present

## 2014-02-17 DIAGNOSIS — Z8249 Family history of ischemic heart disease and other diseases of the circulatory system: Secondary | ICD-10-CM

## 2014-02-17 DIAGNOSIS — K219 Gastro-esophageal reflux disease without esophagitis: Secondary | ICD-10-CM | POA: Diagnosis present

## 2014-02-17 DIAGNOSIS — Z8371 Family history of colonic polyps: Secondary | ICD-10-CM

## 2014-02-17 DIAGNOSIS — O09529 Supervision of elderly multigravida, unspecified trimester: Secondary | ICD-10-CM | POA: Diagnosis present

## 2014-02-17 DIAGNOSIS — D6859 Other primary thrombophilia: Secondary | ICD-10-CM | POA: Diagnosis present

## 2014-02-17 DIAGNOSIS — O34219 Maternal care for unspecified type scar from previous cesarean delivery: Secondary | ICD-10-CM | POA: Diagnosis present

## 2014-02-17 DIAGNOSIS — Z83719 Family history of colon polyps, unspecified: Secondary | ICD-10-CM

## 2014-02-17 DIAGNOSIS — Z98891 History of uterine scar from previous surgery: Secondary | ICD-10-CM

## 2014-02-17 DIAGNOSIS — O9912 Other diseases of the blood and blood-forming organs and certain disorders involving the immune mechanism complicating childbirth: Secondary | ICD-10-CM

## 2014-02-17 DIAGNOSIS — R008 Other abnormalities of heart beat: Secondary | ICD-10-CM | POA: Diagnosis not present

## 2014-02-17 HISTORY — PX: SCAR REVISION: SHX5285

## 2014-02-17 LAB — COMPREHENSIVE METABOLIC PANEL
ALT: 17 U/L (ref 0–35)
AST: 27 U/L (ref 0–37)
Albumin: 2.2 g/dL — ABNORMAL LOW (ref 3.5–5.2)
Alkaline Phosphatase: 124 U/L — ABNORMAL HIGH (ref 39–117)
Anion gap: 13 (ref 5–15)
BUN: 13 mg/dL (ref 6–23)
CO2: 18 mEq/L — ABNORMAL LOW (ref 19–32)
Calcium: 8.8 mg/dL (ref 8.4–10.5)
Chloride: 101 mEq/L (ref 96–112)
Creatinine, Ser: 0.57 mg/dL (ref 0.50–1.10)
GFR calc Af Amer: >90 mL/min (ref >90)
GFR calc non Af Amer: >90 mL/min (ref >90)
Glucose, Bld: 95 mg/dL (ref 70–99)
Potassium: 4.8 mEq/L (ref 3.7–5.3)
Sodium: 132 mEq/L — ABNORMAL LOW (ref 137–147)
Total Bilirubin: 0.2 mg/dL — ABNORMAL LOW (ref 0.3–1.2)
Total Protein: 5.8 g/dL — ABNORMAL LOW (ref 6.0–8.3)

## 2014-02-17 LAB — MAGNESIUM: Magnesium: 1.6 mg/dL (ref 1.5–2.5)

## 2014-02-17 SURGERY — Surgical Case
Anesthesia: Spinal | Site: Abdomen

## 2014-02-17 MED ORDER — ONDANSETRON HCL 4 MG/2ML IJ SOLN: 4.0000 mg | Freq: Three times a day (TID) | INTRAMUSCULAR | Status: DC | PRN

## 2014-02-17 MED ORDER — LIDOCAINE HCL (CARDIAC) 20 MG/ML IV SOLN
INTRAVENOUS | Status: AC
  Administered 2014-02-17: 100 mg via INTRAVENOUS
  Filled 2014-02-17: qty 5

## 2014-02-17 MED ORDER — PHENYLEPHRINE 40 MCG/ML (10ML) SYRINGE FOR IV PUSH (FOR BLOOD PRESSURE SUPPORT)
PREFILLED_SYRINGE | INTRAVENOUS | Status: AC
  Filled 2014-02-17: qty 5

## 2014-02-17 MED ORDER — IBUPROFEN 600 MG PO TABS: 600.0000 mg | ORAL_TABLET | Freq: Four times a day (QID) | ORAL | Status: DC | PRN

## 2014-02-17 MED ORDER — ENOXAPARIN SODIUM 40 MG/0.4ML ~~LOC~~ SOLN
40.0000 mg | SUBCUTANEOUS | Status: DC
  Filled 2014-02-17: qty 0.4

## 2014-02-17 MED ORDER — FENTANYL CITRATE 0.05 MG/ML IJ SOLN
INTRAMUSCULAR | Status: AC
  Filled 2014-02-17: qty 2

## 2014-02-17 MED ORDER — HYDROMORPHONE HCL PF 1 MG/ML IJ SOLN
INTRAMUSCULAR | Status: AC
  Administered 2014-02-17: 0.5 mg via INTRAVENOUS
  Filled 2014-02-17: qty 1

## 2014-02-17 MED ORDER — NALBUPHINE HCL 10 MG/ML IJ SOLN
INTRAMUSCULAR | Status: AC
  Administered 2014-02-17: 10 mg via SUBCUTANEOUS
  Filled 2014-02-17: qty 1

## 2014-02-17 MED ORDER — ONDANSETRON HCL 4 MG/2ML IJ SOLN
INTRAMUSCULAR | Status: AC
  Filled 2014-02-17: qty 2

## 2014-02-17 MED ORDER — MEPERIDINE HCL 25 MG/ML IJ SOLN: 6.2500 mg | INTRAMUSCULAR | Status: DC | PRN

## 2014-02-17 MED ORDER — LACTATED RINGERS IV SOLN
Freq: Once | INTRAVENOUS | Status: AC
  Administered 2014-02-17 (×3): via INTRAVENOUS

## 2014-02-17 MED ORDER — PHENYLEPHRINE 8 MG IN D5W 100 ML (0.08MG/ML) PREMIX OPTIME
INJECTION | INTRAVENOUS | Status: DC | PRN
  Administered 2014-02-17: 60 ug/min via INTRAVENOUS

## 2014-02-17 MED ORDER — PHENYLEPHRINE HCL 10 MG/ML IJ SOLN
INTRAMUSCULAR | Status: AC
Start: 2014-02-17 — End: 2014-02-17
  Filled 2014-02-17: qty 1

## 2014-02-17 MED ORDER — PHENYLEPHRINE 8 MG IN D5W 100 ML (0.08MG/ML) PREMIX OPTIME
INJECTION | INTRAVENOUS | Status: AC
Start: 2014-02-17 — End: 2014-02-17
  Filled 2014-02-17: qty 100

## 2014-02-17 MED ORDER — TETANUS-DIPHTH-ACELL PERTUSSIS 5-2.5-18.5 LF-MCG/0.5 IM SUSP
0.5000 mL | Freq: Once | INTRAMUSCULAR | Status: DC
  Filled 2014-02-17: qty 0.5

## 2014-02-17 MED ORDER — BUPIVACAINE HCL (PF) 0.5 % IJ SOLN
Freq: Once | INTRAMUSCULAR | Status: AC
  Administered 2014-02-17: 13:00:00 via INTRAMUSCULAR
  Filled 2014-02-17 (×2): qty 1

## 2014-02-17 MED ORDER — SIMETHICONE 80 MG PO CHEW
80.0000 mg | CHEWABLE_TABLET | ORAL | Status: DC | PRN
  Filled 2014-02-17: qty 1

## 2014-02-17 MED ORDER — ONDANSETRON HCL 4 MG/2ML IJ SOLN: 4.0000 mg | INTRAMUSCULAR | Status: DC | PRN

## 2014-02-17 MED ORDER — OXYCODONE-ACETAMINOPHEN 5-325 MG PO TABS
1.0000 | ORAL_TABLET | ORAL | Status: DC | PRN
  Administered 2014-02-17 – 2014-02-19 (×2): 1 via ORAL
  Filled 2014-02-17 (×2): qty 1

## 2014-02-17 MED ORDER — MORPHINE SULFATE (PF) 0.5 MG/ML IJ SOLN
INTRAMUSCULAR | Status: DC | PRN
  Administered 2014-02-17: .2 mg via INTRATHECAL

## 2014-02-17 MED ORDER — KETOROLAC TROMETHAMINE 30 MG/ML IJ SOLN
INTRAMUSCULAR | Status: AC
  Administered 2014-02-17: 30 mg via INTRAMUSCULAR
  Filled 2014-02-17: qty 1

## 2014-02-17 MED ORDER — SCOPOLAMINE 1 MG/3DAYS TD PT72
MEDICATED_PATCH | TRANSDERMAL | Status: AC
  Administered 2014-02-17: 1.5 mg via TRANSDERMAL
  Filled 2014-02-17: qty 1

## 2014-02-17 MED ORDER — FENTANYL CITRATE 0.05 MG/ML IJ SOLN
INTRAMUSCULAR | Status: DC | PRN
  Administered 2014-02-17: 10 ug via INTRATHECAL

## 2014-02-17 MED ORDER — DEXAMETHASONE SODIUM PHOSPHATE 4 MG/ML IJ SOLN
INTRAMUSCULAR | Status: DC | PRN
  Administered 2014-02-17: 4 mg via INTRAVENOUS

## 2014-02-17 MED ORDER — DIPHENHYDRAMINE HCL 25 MG PO CAPS
25.0000 mg | ORAL_CAPSULE | ORAL | Status: DC | PRN
  Filled 2014-02-17: qty 1

## 2014-02-17 MED ORDER — SIMETHICONE 80 MG PO CHEW
80.0000 mg | CHEWABLE_TABLET | Freq: Three times a day (TID) | ORAL | Status: DC
  Administered 2014-02-18 – 2014-02-19 (×4): 80 mg via ORAL
  Filled 2014-02-17 (×9): qty 1

## 2014-02-17 MED ORDER — NALOXONE HCL 0.4 MG/ML IJ SOLN: 0.4000 mg | INTRAMUSCULAR | Status: DC | PRN

## 2014-02-17 MED ORDER — DIPHENHYDRAMINE HCL 50 MG/ML IJ SOLN: 25.0000 mg | INTRAMUSCULAR | Status: DC | PRN

## 2014-02-17 MED ORDER — PHENYLEPHRINE HCL 10 MG/ML IJ SOLN
INTRAMUSCULAR | Status: AC
  Filled 2014-02-17: qty 1

## 2014-02-17 MED ORDER — SCOPOLAMINE 1 MG/3DAYS TD PT72
1.0000 | MEDICATED_PATCH | Freq: Once | TRANSDERMAL | Status: DC
  Administered 2014-02-17: 1.5 mg via TRANSDERMAL

## 2014-02-17 MED ORDER — GLYCOPYRROLATE 0.2 MG/ML IJ SOLN
INTRAMUSCULAR | Status: DC | PRN
  Administered 2014-02-17: 0.2 mg via INTRAVENOUS

## 2014-02-17 MED ORDER — IBUPROFEN 600 MG PO TABS
600.0000 mg | ORAL_TABLET | Freq: Four times a day (QID) | ORAL | Status: DC
  Administered 2014-02-17 – 2014-02-19 (×7): 600 mg via ORAL
  Filled 2014-02-17 (×7): qty 1

## 2014-02-17 MED ORDER — CEFAZOLIN SODIUM-DEXTROSE 2-3 GM-% IV SOLR
INTRAVENOUS | Status: AC
  Filled 2014-02-17: qty 50

## 2014-02-17 MED ORDER — CEFAZOLIN SODIUM-DEXTROSE 2-3 GM-% IV SOLR
2.0000 g | INTRAVENOUS | Status: AC
  Administered 2014-02-17: 2 g via INTRAVENOUS

## 2014-02-17 MED ORDER — NALBUPHINE HCL 10 MG/ML IJ SOLN: 5.0000 mg | INTRAMUSCULAR | Status: DC | PRN

## 2014-02-17 MED ORDER — MORPHINE SULFATE 0.5 MG/ML IJ SOLN
INTRAMUSCULAR | Status: AC
  Filled 2014-02-17: qty 10

## 2014-02-17 MED ORDER — KETOROLAC TROMETHAMINE 30 MG/ML IJ SOLN
30.0000 mg | Freq: Four times a day (QID) | INTRAMUSCULAR | Status: AC | PRN
  Administered 2014-02-17: 30 mg via INTRAMUSCULAR

## 2014-02-17 MED ORDER — PROMETHAZINE HCL 25 MG/ML IJ SOLN: 6.2500 mg | INTRAMUSCULAR | Status: DC | PRN

## 2014-02-17 MED ORDER — LIDOCAINE HCL (CARDIAC) 20 MG/ML IV SOLN
100.0000 mg | Freq: Once | INTRAVENOUS | Status: AC
  Administered 2014-02-17: 100 mg via INTRAVENOUS

## 2014-02-17 MED ORDER — ONDANSETRON HCL 4 MG PO TABS: 4.0000 mg | ORAL_TABLET | ORAL | Status: DC | PRN

## 2014-02-17 MED ORDER — KETOROLAC TROMETHAMINE 30 MG/ML IJ SOLN: 30.0000 mg | Freq: Four times a day (QID) | INTRAMUSCULAR | Status: AC | PRN

## 2014-02-17 MED ORDER — SENNOSIDES-DOCUSATE SODIUM 8.6-50 MG PO TABS
2.0000 | ORAL_TABLET | ORAL | Status: DC
  Administered 2014-02-17 – 2014-02-19 (×2): 2 via ORAL
  Filled 2014-02-17 (×3): qty 2

## 2014-02-17 MED ORDER — MENTHOL 3 MG MT LOZG
1.0000 | LOZENGE | OROMUCOSAL | Status: DC | PRN
  Filled 2014-02-17: qty 9

## 2014-02-17 MED ORDER — ASPIRIN 81 MG PO CHEW
81.0000 mg | CHEWABLE_TABLET | Freq: Every day | ORAL | Status: DC
  Filled 2014-02-17 (×3): qty 1

## 2014-02-17 MED ORDER — NALBUPHINE HCL 10 MG/ML IJ SOLN
5.0000 mg | INTRAMUSCULAR | Status: DC | PRN
  Administered 2014-02-17: 10 mg via SUBCUTANEOUS

## 2014-02-17 MED ORDER — BUPIVACAINE HCL (PF) 0.5 % IJ SOLN
INTRAMUSCULAR | Status: AC
  Filled 2014-02-17: qty 30

## 2014-02-17 MED ORDER — 0.9 % SODIUM CHLORIDE (POUR BTL) OPTIME
TOPICAL | Status: DC | PRN
  Administered 2014-02-17: 1000 mL

## 2014-02-17 MED ORDER — OXYTOCIN 10 UNIT/ML IJ SOLN
INTRAMUSCULAR | Status: AC
  Filled 2014-02-17: qty 4

## 2014-02-17 MED ORDER — KETOROLAC TROMETHAMINE 30 MG/ML IJ SOLN: 15.0000 mg | Freq: Once | INTRAMUSCULAR | Status: DC | PRN

## 2014-02-17 MED ORDER — SIMETHICONE 80 MG PO CHEW
80.0000 mg | CHEWABLE_TABLET | ORAL | Status: DC
  Administered 2014-02-17 – 2014-02-19 (×2): 80 mg via ORAL
  Filled 2014-02-17 (×4): qty 1

## 2014-02-17 MED ORDER — LIDOCAINE HCL (CARDIAC) 20 MG/ML IV SOLN
INTRAVENOUS | Status: DC | PRN
  Administered 2014-02-17: 100 mg via INTRAVENOUS

## 2014-02-17 MED ORDER — SODIUM CHLORIDE 0.9 % IJ SOLN: 3.0000 mL | INTRAMUSCULAR | Status: DC | PRN

## 2014-02-17 MED ORDER — NALOXONE HCL 1 MG/ML IJ SOLN
1.0000 ug/kg/h | INTRAVENOUS | Status: DC | PRN
  Filled 2014-02-17: qty 2

## 2014-02-17 MED ORDER — DEXAMETHASONE SODIUM PHOSPHATE 4 MG/ML IJ SOLN
INTRAMUSCULAR | Status: AC
  Filled 2014-02-17: qty 1

## 2014-02-17 MED ORDER — ONDANSETRON HCL 4 MG/2ML IJ SOLN
INTRAMUSCULAR | Status: DC | PRN
  Administered 2014-02-17: 4 mg via INTRAVENOUS

## 2014-02-17 MED ORDER — BUPIVACAINE IN DEXTROSE 0.75-8.25 % IT SOLN
INTRATHECAL | Status: DC | PRN
  Administered 2014-02-17: 10.5 mg via INTRATHECAL

## 2014-02-17 MED ORDER — DIPHENHYDRAMINE HCL 50 MG/ML IJ SOLN
12.5000 mg | INTRAMUSCULAR | Status: DC | PRN
  Administered 2014-02-17: 12.5 mg via INTRAVENOUS
  Filled 2014-02-17: qty 1

## 2014-02-17 MED ORDER — LIDOCAINE HCL (CARDIAC) 20 MG/ML IV SOLN
INTRAVENOUS | Status: AC
  Filled 2014-02-17: qty 5

## 2014-02-17 MED ORDER — OXYCODONE-ACETAMINOPHEN 5-325 MG PO TABS
2.0000 | ORAL_TABLET | ORAL | Status: DC | PRN
  Administered 2014-02-18 – 2014-02-19 (×4): 2 via ORAL
  Filled 2014-02-17 (×4): qty 2

## 2014-02-17 MED ORDER — LANOLIN HYDROUS EX OINT: 1.0000 "application " | TOPICAL_OINTMENT | CUTANEOUS | Status: DC | PRN

## 2014-02-17 MED ORDER — LACTATED RINGERS IV SOLN
INTRAVENOUS | Status: DC
  Administered 2014-02-17 – 2014-02-18 (×2): via INTRAVENOUS

## 2014-02-17 MED ORDER — OXYTOCIN 40 UNITS IN LACTATED RINGERS INFUSION - SIMPLE MED: 62.5000 mL/h | INTRAVENOUS | Status: AC

## 2014-02-17 MED ORDER — OXYTOCIN 40 UNITS IN LACTATED RINGERS INFUSION - SIMPLE MED
INTRAVENOUS | Status: DC | PRN
  Administered 2014-02-17: 40 [IU] via INTRAVENOUS

## 2014-02-17 MED ORDER — GLYCOPYRROLATE 0.2 MG/ML IJ SOLN
INTRAMUSCULAR | Status: AC
  Filled 2014-02-17: qty 1

## 2014-02-17 MED ORDER — PRENATAL MULTIVITAMIN CH
1.0000 | ORAL_TABLET | Freq: Every day | ORAL | Status: DC
  Administered 2014-02-18 – 2014-02-19 (×2): 1 via ORAL
  Filled 2014-02-17 (×4): qty 1

## 2014-02-17 MED ORDER — ZOLPIDEM TARTRATE 5 MG PO TABS: 5.0000 mg | ORAL_TABLET | Freq: Every evening | ORAL | Status: DC | PRN

## 2014-02-17 MED ORDER — WITCH HAZEL-GLYCERIN EX PADS: 1.0000 "application " | MEDICATED_PAD | CUTANEOUS | Status: DC | PRN

## 2014-02-17 MED ORDER — METOCLOPRAMIDE HCL 5 MG/ML IJ SOLN: 10.0000 mg | Freq: Three times a day (TID) | INTRAMUSCULAR | Status: DC | PRN

## 2014-02-17 MED ORDER — DIPHENHYDRAMINE HCL 25 MG PO CAPS: 25.0000 mg | ORAL_CAPSULE | Freq: Four times a day (QID) | ORAL | Status: DC | PRN

## 2014-02-17 MED ORDER — DIBUCAINE 1 % RE OINT
1.0000 "application " | TOPICAL_OINTMENT | RECTAL | Status: DC | PRN
  Filled 2014-02-17: qty 28

## 2014-02-17 MED ORDER — HYDROMORPHONE HCL PF 1 MG/ML IJ SOLN
0.2500 mg | INTRAMUSCULAR | Status: DC | PRN
  Administered 2014-02-17 (×3): 0.5 mg via INTRAVENOUS

## 2014-02-17 SURGICAL SUPPLY — 35 items
BENZOIN TINCTURE PRP APPL 2/3 (GAUZE/BANDAGES/DRESSINGS) ×3 IMPLANT
BLADE SURG 10 STRL SS (BLADE) ×6 IMPLANT
CLAMP CORD UMBIL (MISCELLANEOUS) ×3 IMPLANT
CLOSURE WOUND 1/2 X4 (GAUZE/BANDAGES/DRESSINGS) ×1
CLOTH BEACON ORANGE TIMEOUT ST (SAFETY) ×3 IMPLANT
CONTAINER PREFILL 10% NBF 15ML (MISCELLANEOUS) IMPLANT
DRAPE LG THREE QUARTER DISP (DRAPES) ×3 IMPLANT
DRSG OPSITE POSTOP 4X10 (GAUZE/BANDAGES/DRESSINGS) ×3 IMPLANT
DURAPREP 26ML APPLICATOR (WOUND CARE) ×3 IMPLANT
ELECT REM PT RETURN 9FT ADLT (ELECTROSURGICAL) ×3
ELECTRODE REM PT RTRN 9FT ADLT (ELECTROSURGICAL) ×1 IMPLANT
EXTRACTOR VACUUM M CUP 4 TUBE (SUCTIONS) IMPLANT
EXTRACTOR VACUUM M CUP 4' TUBE (SUCTIONS)
GLOVE BIO SURGEON STRL SZ7.5 (GLOVE) ×3 IMPLANT
GLOVE BIOGEL PI IND STRL 7.5 (GLOVE) ×1 IMPLANT
GLOVE BIOGEL PI INDICATOR 7.5 (GLOVE) ×2
GOWN STRL REUS W/TWL LRG LVL3 (GOWN DISPOSABLE) ×9 IMPLANT
KIT ABG SYR 3ML LUER SLIP (SYRINGE) IMPLANT
NEEDLE HYPO 25X5/8 SAFETYGLIDE (NEEDLE) IMPLANT
NS IRRIG 1000ML POUR BTL (IV SOLUTION) ×3 IMPLANT
PACK C SECTION WH (CUSTOM PROCEDURE TRAY) ×3 IMPLANT
PAD OB MATERNITY 4.3X12.25 (PERSONAL CARE ITEMS) ×3 IMPLANT
RETRACTOR WND ALEXIS 25 LRG (MISCELLANEOUS) ×1 IMPLANT
RTRCTR WOUND ALEXIS 25CM LRG (MISCELLANEOUS) ×3
STRIP CLOSURE SKIN 1/2X4 (GAUZE/BANDAGES/DRESSINGS) ×2 IMPLANT
SUT CHROMIC 2 0 CT 1 (SUTURE) ×3 IMPLANT
SUT MNCRL AB 3-0 PS2 27 (SUTURE) ×3 IMPLANT
SUT PLAIN 0 NONE (SUTURE) IMPLANT
SUT PLAIN 2 0 XLH (SUTURE) ×3 IMPLANT
SUT VIC AB 0 CT1 36 (SUTURE) ×3 IMPLANT
SUT VIC AB 0 CTX 36 (SUTURE) ×6
SUT VIC AB 0 CTX36XBRD ANBCTRL (SUTURE) ×3 IMPLANT
TOWEL OR 17X24 6PK STRL BLUE (TOWEL DISPOSABLE) ×3 IMPLANT
TRAY FOLEY CATH 14FR (SET/KITS/TRAYS/PACK) ×3 IMPLANT
WATER STERILE IRR 1000ML POUR (IV SOLUTION) IMPLANT

## 2014-02-17 NOTE — Anesthesia Procedure Notes (Signed)
Spinal  Patient location during procedure: OR Start time: 02/17/2014 11:18 AM End time: 02/17/2014 11:21 AM Staffing Anesthesiologist: Lyn Hollingshead Performed by: anesthesiologist  Preanesthetic Checklist Completed: patient identified, surgical consent, pre-op evaluation, timeout performed, IV checked, risks and benefits discussed and monitors and equipment checked Spinal Block Patient position: sitting Prep: site prepped and draped and DuraPrep Patient monitoring: heart rate, cardiac monitor, continuous pulse ox and blood pressure Approach: midline Location: L3-4 Injection technique: single-shot Needle Needle type: Pencan  Needle gauge: 24 G Needle length: 9 cm Needle insertion depth: 5 cm Assessment Sensory level: T4

## 2014-02-17 NOTE — Consult Note (Signed)
CARDIOLOGY CONSULT NOTE   Patient ID: Anna Berger MRN: [id number], DOB/AGE: 1976-02-06   Admit date: 02/17/2014 Date of Consult: 02/17/2014  Primary Physician: Wynelle Fanny Primary Cardiologist: Dr Quay Burow  Reason for consult:  Bigeminy post C section  Problem List  Past Medical History  Diagnosis Date  . Hyperlipemia   . Endometritis   . Iron deficiency anemia   . Factor V deficiency   . Unspecified vitamin D deficiency   . Insomnia, unspecified   . Family history of malignant neoplasm of gastrointestinal tract   . Vitamin B12 deficiency   . UC (ulcerative colitis)   . Clotting disorder   . GERD (gastroesophageal reflux disease)   . Rapid palpitations   . Hyperlipidemia   . Family history of coronary artery disease   . Blood dyscrasia     Factor V mutation  . Hypercholesteremia     Past Surgical History  Procedure Laterality Date  . Cesarean section  02/2006  . Ovarian cyst removal  2012  . Colonoscopy    . Polypectomy       Allergies  No Known Allergies  HPI    A very pleasant 38 year old female who just delivered a healthy baby boy via C-section. We were consulted for frequent bigeminy and trigeminy on cardiac monitor. She has h/o factor V Leiden, ulcerative colitis. The patient was seen in her 29th week of pregnancy by Dr Gwenlyn Found because of new onset palpitations. She had no symptoms to her for this pregnancy. She does admit to caffeine intake as well as using stimulant such as Claritin-D. The palpitations began at about 15 weeks with her daily. She has had no episodes of presyncope. She denies chest pain, ocassionally feel mild SOB with palpitations. They never last more than few seconds. No previous syncope. No palpitations in her first pregnancy 7 years ago. Her cardiac risk factor profile is remarkable for family history of heart disease with a father who had his first myocardial infarction in his 69s. She also has hyperlipidemia on statin  therapy which is no longer on currently since she is pregnant.  Dr Gwenlyn Found concluded that her presentation is due hormonal changes of pregnancy and didn't feel that further Holter monitoring or echocardiogram was necessary.  Today she presented for a scheduled C-section at 39 weeks. It was uneventful. It was noted on her monitor that she has frequent ventricular ectopy in a pattern of bigeminy or trigeminy after almost every beat. The patient is minimally symptomatic with some mild palpitations, no chest pain or shortness of breath. She has received iv Lidocaine for her arrhythmia with no change.  She is scheduled to be on 6 weeks of Lovenox 40 mg [place] daily.   Inpatient Medications  . aspirin  81 mg Oral Daily  . ceFAZolin      . [START ON 02/18/2014] enoxaparin (LOVENOX) injection  40 mg Subcutaneous Q24H  . [START ON 02/18/2014] ibuprofen  600 mg Oral 4 times per day  . prenatal multivitamin  1 tablet Oral Q1200  . [START ON 02/18/2014] senna-docusate  2 tablet Oral Q24H  . simethicone  80 mg Oral TID PC  . [START ON 02/18/2014] simethicone  80 mg Oral Q24H  . [START ON 02/18/2014] Tdap  0.5 mL Intramuscular Once    Family History Family History  Problem Relation Age of Onset  . Hypertension Father   . Coronary artery disease Father   . Heart attack Father 72  . Hyperlipidemia Father   .  Hyperlipidemia Brother   . Crohn's disease Maternal Uncle   . Colon cancer Maternal Uncle   . Colon polyps Paternal Aunt   . Diabetes Maternal Uncle      Social History History   Social History  . Marital Status: Married    Spouse Name: N/A    Number of Children: 1  . 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 Smoking status: Never Smoker   . Smokeless tobacco: Never Used  . Alcohol Use: No     Comment: social  . Drug Use: No  . Sexual Activity: Yes   Other Topics Concern  . Not on file   Social History Narrative  . No  narrative on file     Review of Systems  General:  No chills, fever, night sweats or weight changes.  Cardiovascular:  No chest pain, dyspnea on exertion, edema, orthopnea, palpitations, paroxysmal nocturnal dyspnea. Dermatological: No rash, lesions/masses Respiratory: No cough, dyspnea Urologic: No hematuria, dysuria Abdominal:   No nausea, vomiting, diarrhea, bright red blood per rectum, melena, or hematemesis Neurologic:  No visual changes, wkns, changes in mental status. All other systems reviewed and are otherwise negative except as noted above.  Physical Exam  Blood pressure 109/53, pulse 48, temperature 97.6 F (36.4 C), temperature source Oral, resp. rate 13, height 5' 3"  (1.6 m), weight 154 lb 4.8 oz (69.99 kg), last menstrual period 05/20/2013, SpO2 97.00%, unknown if currently breastfeeding.  General: Pleasant, NAD Psych: Normal affect. Neuro: Alert and oriented X 3. Moves all extremities spontaneously. HEENT: Normal  Neck: Supple without bruits or JVD. Lungs:  Resp regular and unlabored, CTA. Heart: RRR no s3, s4, or murmurs. Abdomen: Soft, non-tender, distended, consistent with postpartum state, BS + x 4.  Extremities: No clubbing, cyanosis , mild non-pitting edema B/L. DP/PT/Radials 2+ and equal bilaterally.  Labs  No results found for this basename: CKTOTAL, CKMB, TROPONINI,  in the last 72 hours Lab Results  Component Value Date   WBC 15.1* 02/14/2014   HGB 12.7 02/14/2014   HCT 37.8 02/14/2014   MCV 98.4 02/14/2014   PLT 252 02/14/2014   No results found for this basename: NA, K, CL, CO2, BUN, CREATININE, CALCIUM, LABALBU, PROT, BILITOT, ALKPHOS, ALT, AST, GLUCOSE,  in the last 168 hours No results found for this basename: CHOL, HDL, LDLCALC, TRIG   Radiology/Studies  Echocardiogram - none  Telemetry: SR, frequent bigeminy and trigeminy     ASSESSMENT AND PLAN  38 year old female, post scheduled C-section today (02/17/2014)  1. Frequent ventricular  ectopy - possibly due to electrolyte dysbalance and volume shift postpartum. We would recommend to check comprehensive metabolic profile, including Ca and magnesium and replace if abnormal. Considering the frequency of ectopy and prior h/o frequent PVCs  I would order and echocardiogram to evaluate for possible structural heart disease.  If her palpitations become symptomatic or she developed nsVT, small doses of Metoprolol 12. 5 mg po BID can be used.  2. BP - well controlled    Signed, Dorothy Spark, MD, [facility] 02/17/2014, 7:07 PM

## 2014-02-17 NOTE — Progress Notes (Signed)
Pt rec'd from PACU for observation/telemetry for SR w/V bigeminy/trigeminy  following repeat C-section.

## 2014-02-17 NOTE — Addendum Note (Signed)
Addendum created 02/17/14 1743 by Billie Lade, CRNA   Modules edited: Notes Section   Notes Section:  File: [id number]

## 2014-02-17 NOTE — Progress Notes (Signed)
Called Dr Mancel Bale for POC - requested RN to contact [place]. Elink RN stated that we could contact Dr Alvester Chou, cardiologist since he saw the pt during her pregnancy

## 2014-02-17 NOTE — Addendum Note (Signed)
Addendum created 02/17/14 1444 by Raenette Rover, CRNA   Modules edited: Anesthesia LDA

## 2014-02-17 NOTE — Transfer of Care (Signed)
Immediate Anesthesia Transfer of Care Note  Patient: Anna Berger  Procedure(s) Performed: Procedure(s): REPEAT CESAREAN SECTION (N/A) SCAR REVISION (N/A)  Patient Location: PACU  Anesthesia Type:Spinal  Level of Consciousness: awake, alert  and oriented  Airway & Oxygen Therapy: Patient Spontanous Breathing  Post-op Assessment: Report given to PACU RN  Post vital signs: Reviewed  Complications: No apparent anesthesia complications

## 2014-02-17 NOTE — Op Note (Signed)
Cesarean Section Procedure Note  Indications: 39wks with h/o prior c-section  Pre-operative Diagnosis: Previous Cesarean Section, desires repeat with scar revision   Post-operative Diagnosis: Previous Cesarean Section, desires repeat with scar revision  Procedure: CESAREAN SECTION  Surgeon: Delice Lesch, MD    Assistants: Donnel Saxon, CNM  Anesthesia: Spinal  Anesthesiologist: Lyn Hollingshead, MD   Procedure Details  The patient was taken to the operating room secondary to h/o c-section after the risks, benefits, complications, treatment options, and expected outcomes were discussed with the patient.  The patient concurred with the proposed plan, giving informed consent which was signed and witnessed. The patient was taken to Operating Room 2, identified as Anna Berger and the procedure verified as C-Section Delivery. A Time Out was held and the above information confirmed.  After induction of anesthesia by obtaining a spinal, the patient was prepped and draped in the usual sterile manner. A Pfannenstiel skin incision was made and carried down through the subcutaneous tissue to the underlying layer of fascia.  The fascia was incised bilaterally and extended transversely bilaterally with the Mayo scissors. Kocher clamps were placed on the inferior aspect of the fascial incision and the underlying rectus muscle was separated from the fascia. The same was done on the superior aspect of the fascial incision.  The peritoneum was identified, entered bluntly and extended manually.  An Alexis self-retaining retractor was placed.  The utero-vesical peritoneal reflection was incised transversely and the bladder flap was bluntly freed from the lower uterine segment. A low transverse uterine incision was made with the scalpel and extended bilaterally with the bandage scissors.  The infant was delivered in vertex position without difficulty.  After the umbilical cord was clamped and cut, the infant  was handed to the awaiting pediatricians.  Cord blood was obtained for evaluation.  The placenta was removed intact and appeared to be within normal limits. The uterus was cleared of all clots and debris. The uterine incision was closed with running interlocking sutures of 0 Vicryl and a second imbricating layer was performed as well.   Bilateral tubes and ovaries appeared to be within normal limits.  Good hemostasis was noted.  Copious irrigation was performed until clear.  The peritoneum was repaired with 2-0 chromic via a running suture.  The fascia was reapproximated with a running suture of 0 Vicryl. The bulky scar tissue beneath the upper aspect of the incision was excised. The skin was reapproximated with a subcuticular suture of 3-0 monocryl.  Steristrips were applied.  Instrument, sponge, and needle counts were correct prior to abdominal closure and at the conclusion of the case.  The patient was awaiting transfer to the recovery room in good condition.  Findings: Live female infant with Apgars 9 at one minute and 9 at five minutes.  Normal appearing bilateral ovaries and fallopian tubes were noted.  Estimated Blood Loss:  700 ml         Drains: 200 cc         Total IV Fluids: 2500 ml         Specimens to Pathology: Placenta         Complications:  None; patient tolerated the procedure well.         Disposition: PACU - hemodynamically stable.         Condition: stable  Attending Attestation: I performed the procedure.

## 2014-02-17 NOTE — Anesthesia Postprocedure Evaluation (Signed)
Anesthesia Post Note  Patient: Anna Berger  Procedure(s) Performed: Procedure(s) (LRB): REPEAT CESAREAN SECTION (N/A) SCAR REVISION (N/A)  Anesthesia type: Spinal  Patient location: PACU  Post pain: Pain level controlled  Post assessment: Post-op Vital signs reviewed  Last Vitals:  Filed Vitals:   02/17/14 1330  BP: 113/59  Pulse: 79  Temp:   Resp: 20    Post vital signs: Reviewed  Level of consciousness: awake  Complications: No apparent anesthesia complications

## 2014-02-17 NOTE — Progress Notes (Signed)
Dr Clovis Fredrickson on-call for cardiology - notified of pt seen during pregnancy by Dr Alvester Chou now having V bigeminy/trigeminy after Csection. Requested MD to see pt in AICU. Dr Mancel Bale notified of same.

## 2014-02-17 NOTE — Anesthesia Postprocedure Evaluation (Signed)
  Anesthesia Post-op Note  Anesthesia Post Note  Patient: Anna Berger  Procedure(s) Performed: Procedure(s) (LRB): REPEAT CESAREAN SECTION (N/A) SCAR REVISION (N/A)  Anesthesia type: Spinal  Patient location: Mother/Baby  Post pain: Pain level controlled  Post assessment: Post-op Vital signs reviewed  Last Vitals:  Filed Vitals:   02/17/14 1715  BP:   Pulse:   Temp:   Resp: 15    Post vital signs: Reviewed  Level of consciousness: awake  Complications: No apparent anesthesia complications

## 2014-02-17 NOTE — Anesthesia Preprocedure Evaluation (Signed)
Anesthesia Evaluation  Patient identified by MRN, date of birth, ID band Patient awake    Reviewed: Allergy & Precautions, H&P , NPO status , Patient's Chart, lab work & pertinent test results  Airway Mallampati: I TM Distance: >3 FB Neck ROM: full    Dental no notable dental hx.    Pulmonary neg pulmonary ROS,    Pulmonary exam normal       Cardiovascular negative cardio ROS      Neuro/Psych negative neurological ROS  negative psych ROS   GI/Hepatic Neg liver ROS, GERD-  Medicated and Controlled,  Endo/Other  negative endocrine ROS  Renal/GU negative Renal ROS     Musculoskeletal   Abdominal Normal abdominal exam  (+)   Peds  Hematology   Anesthesia Other Findings   Reproductive/Obstetrics (+) Pregnancy                           Anesthesia Physical Anesthesia Plan  ASA: II  Anesthesia Plan: Spinal   Post-op Pain Management:    Induction:   Airway Management Planned:   Additional Equipment:   Intra-op Plan:   Post-operative Plan:   Informed Consent: I have reviewed the patients History and Physical, chart, labs and discussed the procedure including the risks, benefits and alternatives for the proposed anesthesia with the patient or authorized representative who has indicated his/her understanding and acceptance.     Plan Discussed with: CRNA and Surgeon  Anesthesia Plan Comments:         Anesthesia Quick Evaluation

## 2014-02-18 ENCOUNTER — Encounter (HOSPITAL_COMMUNITY): Payer: Self-pay | Admitting: Obstetrics and Gynecology

## 2014-02-18 DIAGNOSIS — I059 Rheumatic mitral valve disease, unspecified: Secondary | ICD-10-CM

## 2014-02-18 LAB — CBC
HCT: 32.9 % — ABNORMAL LOW (ref 36.0–46.0)
Hemoglobin: 10.9 g/dL — ABNORMAL LOW (ref 12.0–15.0)
MCH: 32.6 pg (ref 26.0–34.0)
MCHC: 33.1 g/dL (ref 30.0–36.0)
MCV: 98.5 fL (ref 78.0–100.0)
PLATELETS: 235 10*3/uL (ref 150–400)
RBC: 3.34 MIL/uL — ABNORMAL LOW (ref 3.87–5.11)
RDW: 13.8 % (ref 11.5–15.5)
WBC: 19.3 10*3/uL — ABNORMAL HIGH (ref 4.0–10.5)

## 2014-02-18 MED ORDER — ENOXAPARIN SODIUM 40 MG/0.4ML ~~LOC~~ SOLN
40.0000 mg | SUBCUTANEOUS | Status: DC
  Administered 2014-02-18 – 2014-02-19 (×2): 40 mg via SUBCUTANEOUS
  Filled 2014-02-18 (×2): qty 0.4

## 2014-02-18 NOTE — Discharge Summary (Signed)
Cesarean Section Delivery Discharge Summary  Anna Berger  DOB:    1975/08/05 MRN:    [id number] CSN:    [id number]  Date of admission:                  02/17/14  Date of discharge:                   02/19/14  Procedures this admission:  Repeat LTCS, revision of scar, telemetry monitoring, echocardiogram  Date of Delivery: 02/17/14  Newborn Data:  Live born female  Birth Weight: 7 lb 1.1 oz (3205 g) APGAR: 9, 9  Home with mother. Name: Roman Circumcision Plan: Declines  History of Present Illness:  Ms. Anna Berger is a 38 y.o. female, G2P2002, who presents at 17w0dweeks gestation. [facility]    Her pregnancy has been complicated by:  Patient Active Problem List   Diagnosis Date Noted  . S/P repeat low transverse C-section 02/17/2014  . 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 Trigeminy 02/17/2014  . Previous cesarean delivery, antepartum condition or complication 075/64/3329 . Preterm uterine contractions in third trimester, antepartum 01/15/2014  . Palpitations 12/10/2013  . Hyperlipidemia 12/10/2013  . Family history of heart disease 12/10/2013  . GERD (gastroesophageal reflux disease) 08/28/2013  . UC (ulcerative colitis) 08/16/2011  . 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 Factor 5 Leiden mutation, heterozygous 08/16/2011  . 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 Family history of malignant neoplasm of gastrointestinal tract 08/16/2011  . Bilateral ovarian cysts 08/16/2011    Hospital Course--Scheduled Cesarean: Patient was admitted on 02/17/14 for a scheduled repeat cesarean delivery.   She was taken to the operating room, where Dr. RMancel Baleperformed a repeat LTCS under spinal anesthesia, with delivery of a viable female, with weight and Apgars as listed below. Infant was in good condition and remained at the patient's  bedside.  Patient had PVCs during the case, was given Lidocaine, with resolution, then returned, and she received Robinul.  [facility] The patient was taken to recovery in good condition.  Patient planned to breast feed.  On post-op day 1, patient was doing well, tolerating a regular diet, with Hgb of 10.9, with pre-delivery Hgb 12.7.   Dr. KArnoldo Hooker cardiology, saw the patient on the evening of 02/17/14 and ordered CMP and magnesium level and echocardiogram.  By the next am, the trigeminy/bigeminy had resolved, and the echo was normal.  Telemetry was d/c'd, and the patient was transferred to routine pp care. Lovenox 40 mg [place] for DVT prophylaxis was begun on day 1 post-op.   Throughout her stay, her physical exam was WNL, her incision was CDI, and her vital signs remained stable.  By post-op day 2, she was up ad lib, tolerating a regular diet, with good pain control with po med.  She was deemed to have received the full benefit of her hospital stay, and was discharged home in stable condition.  Contraceptive choice was deferred per patient request at the time of d/c.     Feeding:  breast  Contraception:  Will decide later.  Discharge hemoglobin:  Hemoglobin  Date Value Ref Range Status  02/18/2014 10.9* 12.0 - 15.0 g/dL Final  02/14/2014 12.7  12.0 - 15.0 g/dL Final  01/13/2014 13.3  12.0 - 15.0 g/dL Final     HCT  Date Value  Ref Range Status  02/18/2014 32.9* 36.0 - 46.0 % Final  02/14/2014 37.8  36.0 - 46.0 % Final  01/13/2014 39.9  36.0 - 46.0 % Final     WBC  Date Value Ref Range Status  02/18/2014 19.3* 4.0 - 10.5 K/uL Final  02/14/2014 15.1* 4.0 - 10.5 K/uL Final  01/13/2014 14.3* 4.0 - 10.5 K/uL Final    Discharge Physical Exam:   General: alert Lochia: appropriate Uterine Fundus: firm Abdomen:  + bowel sounds, NT Incision: Honeycomb dressing CDI DVT Evaluation: No evidence of DVT seen on physical exam. Negative Homan's  sign.  Intrapartum Procedures: cesarean: low cervical, transverse, repeat Postpartum Procedures: Cardiac telemetry, Lovenox 40 mg [place] daily for DVT prophylaxis Complications-Operative and Postpartum: Intraoperative PVC/bigeminy/trigeminy  Discharge Diagnoses: Term Pregnancy-delivered  Discharge Information:  Activity:           pelvic rest Diet:                routine Medications: Ibuprofen, Percocet and Lovenox 40 mg [place] daily Condition:      stable Instructions:  Discharge to: home     Donnel Saxon [facility] 02/18/2014 3:37 PM

## 2014-02-18 NOTE — Progress Notes (Signed)
Echo Lab  2D Echocardiogram completed.  Larenzo Caples L Yuriy Cui, RDCS 02/18/2014 8:59 AM

## 2014-02-18 NOTE — Progress Notes (Signed)
Subjective: Postpartum Day 1: Cesarean Delivery due to scheduled repeat, with scar revision. In AICU due to runs of bigeminy/trigeminy during surgery and post-op. Patient denies any SOB or chest pain.  Unaware of any palpitations. Patient up to bedside, foley removed around 6am, no spontaneous void yet. Feeding:  Breast Contraceptive plan:  Undecided  Bedside echo in process at present.  Objective: Vital signs in last 24 hours: Temp:  [97.4 F (36.3 C)-98 F (36.7 C)] 98 F (36.7 C) (09/14 2342) Pulse Rate:  [42-93] 67 (09/15 0600) Resp:  [10-27] 14 (09/15 0600) BP: (95-125)/(51-96) 104/64 mmHg (09/15 0547) SpO2:  [95 %-99 %] 98 % (09/15 0600) Weight:  [154 lb 4.8 oz (69.99 kg)-163 lb (73.936 kg)] 154 lb 4.8 oz (69.99 kg) (09/14 1650)  Physical Exam:  General: alert Lochia: appropriate Uterine Fundus: firm Abdomen:  + bowel sounds, mild distension Incision: Honeycomb dressing with old drainage marked DVT Evaluation: No evidence of DVT seen on physical exam. Negative Homan's sign.  SCDs in place.  Seen by cardiology MD last night, with recommendation for CMP this am with echo.   Recent Labs  02/18/14 0601  HGB 10.9*  HCT 32.9*  WBC 19.3*   CMP     Component Value Date/Time   NA 132* 02/17/2014 1955   K 4.8 02/17/2014 1955   CL 101 02/17/2014 1955   CO2 18* 02/17/2014 1955   GLUCOSE 95 02/17/2014 1955   BUN 13 02/17/2014 1955   CREATININE 0.57 02/17/2014 1955   CALCIUM 8.8 02/17/2014 1955   PROT 5.8* 02/17/2014 1955   ALBUMIN 2.2* 02/17/2014 1955   AST 27 02/17/2014 1955   ALT 17 02/17/2014 1955   ALKPHOS 124* 02/17/2014 1955   BILITOT 0.2* 02/17/2014 1955   GFRNONAA >90 02/17/2014 1955   GFRAA >90 02/17/2014 1955   Magnesium 1.6 02/17/14 at 7:55p (WNL)   Assessment/Plan: Status post Cesarean section day 1 Bigeminy/trigeminy intraop and post-op Factor V Leiden Doing well postoperatively.  Plan: Await completion of echo and recommendations from cardiology for plan  of care. Start Lovenox 40 mg [place] daily. Continue current care. Family plans circumcision as inpatient.     Donnel Saxon CNM, [place] 02/18/2014, 8:05 AM

## 2014-02-18 NOTE — Progress Notes (Signed)
Called Dr. Meda Coffee for update on need for telemetry. EKG showing NSR/NSB, rare PVCs, no further bigeminy/trigeminy Echo today WNL.  Patient without c/o. Filed Vitals:   02/18/14 0900 02/18/14 1100 02/18/14 1200 02/18/14 1403  BP:    110/58  Pulse:    65  Temp:   98.7 F (37.1 C)   TempSrc:   Oral   Resp: 17 18  19   Height:      Weight:      SpO2:    96%   Per consult with Dr. Meda Coffee, may d/c telemetry. Patient to f/u with Dr. Gwenlyn Found in 2-3 months, or prn if sx occur. Will transfer out to pp floor.  Donnel Saxon, CNM 02/18/14 2:35p

## 2014-02-18 NOTE — Lactation Note (Signed)
This note was copied from the chart of [place]. Lactation Consultation Note    Initial consult with this mom of a term baby, now 48 hours old. Mom is in AICU for telemetry, and is doing well. Baby was born by c-section. Mom has large, pendular breasts with flat nipples and edema. The baby has what appears to be a short lingual frenulum, posterior, with a bowl shaped tongue with elevation, and an upper lip frenulum that extends to the gum line. Roman's older brother has a space between his teeth. I showed mom my findings, and advised her to speak to her pediatrician, if this impacts breast feeding negatively. I tried finger sucking with the baby, but he would not suck.  I was not able to get the baby to latch to mom's breat. I applied a 20 nipple shileld, and instructed mom how to do so, and she demonstrated with good technique. With the shield, the baby latches deeply, with lots of breast and shield compression, and suckles for about 10 minutes. He did this twice, but no colostrum seen in the shield. I then had mom pump in premie setting, and did hand expression after. I was able to express only a couple of drops of colostrum which were fed to the baby. The baby is acting hungy, and mom agreed to supplementing with formula. I suggested Pregestimil, 20 cal, and this is what mom fed him. The plan is for mom to pump every 3 hours, feed what she can express first, and then offer formula, following the supplement amounts chart Mom also advised to put on a bra to crease dependent edema, and I gave her inverted nipple shells to try.. Mom knows to call for questions,concerns.   Patient Name: Boy Illona Bulman [id number] Date: 02/18/2014 Reason for consult: Initial assessment   Maternal Data Formula Feeding for Exclusion: Yes (mom in AICU) Has patient been taught Hand Expression?: Yes Does the patient have breastfeeding experience prior to this delivery?: No  Feeding Feeding Type: Bottle Fed -  Formula  LATCH Score/Interventions Latch: Repeated attempts needed to sustain latch, nipple held in mouth throughout feeding, stimulation needed to elicit sucking reflex. (baby fed with 20 nipple shiled, but no colostrum seen in shiled) Intervention(s): Adjust position;Assist with latch;Breast massage;Breast compression  Audible Swallowing: None Intervention(s): Skin to skin;Hand expression  Type of Nipple: Flat Intervention(s): Shells;Double electric pump;Reverse pressure  Comfort (Breast/Nipple): Soft / non-tender     Hold (Positioning): Assistance needed to correctly position infant at breast and maintain latch. Intervention(s): Breastfeeding basics reviewed;Support Pillows;Position options;Skin to skin  LATCH Score: 5  Lactation Tools Discussed/Used Tools: Pump;Nipple [place] Fuel;Shells Nipple shield size: 20 Shell Type: Inverted;Other (comment) (pendulant breasts with dependent orange peel edema ) Breast pump type: Double-Electric Breast Pump Pump Review: Setup, frequency, and cleaning;Milk Storage;Other (comment) (hand expression and review of breast feeding pages from baby and me book) Initiated by:: clee rncl Date initiated:: 02/18/14   Consult Status Consult Status: Follow-up Date: 02/19/14 Follow-up type: In-patient    Tonna Corner 02/18/2014, 2:36 PM

## 2014-02-18 NOTE — Progress Notes (Signed)
UR chart review completed.  

## 2014-02-19 MED ORDER — IBUPROFEN 600 MG PO TABS: 600.0000 mg | ORAL_TABLET | Freq: Four times a day (QID) | ORAL | Status: DC | PRN

## 2014-02-19 MED ORDER — ENOXAPARIN SODIUM 40 MG/0.4ML ~~LOC~~ SOLN: 40.0000 mg | SUBCUTANEOUS | Status: DC

## 2014-02-19 MED ORDER — OXYCODONE-ACETAMINOPHEN 5-325 MG PO TABS: 1.0000 | ORAL_TABLET | ORAL | Status: DC | PRN

## 2014-02-19 NOTE — Lactation Note (Addendum)
This note was copied from the chart of [place]. Lactation Consultation Note  Patient Name: Anna Berger [id number] Date: 02/19/2014 Reason for consult: Follow-up assessment Per mom "My nipples are sore " and to sore to latch so I've been feeding a bottle . With  Moms permission assessed breast and nipples. Noted both nipples to be pinky red , left worse than right. Also areolas swollen and breast are filling. Mom has been using the shells and comfort gels.  Due to sore ness, LC recommended giving the nipples a break from latching until the soreness improves. And to be consistent with pumping both breast every 2-3 hours for 15 - 39mns and hand expressing. Reviewed sore nipple and engorgement prevention and tx . LC called VElder NegusCNM to call in prescription  [facility] as a preventive measure to heal sore nipples quickly. Mom and dad aware. Mom mentioned she has a DEBP Medela pump at home that is 39years old and only used briefly with their 1st baby. LC suggested to try the pump and if the pressure isn't similar to the pump she has been using in the hospital to come and rent a DEBP  Until their Insurance pump comes in .[facility] their insurance company about the DDavis.  [facility] [place] [place] given to mom.  Addendum _ [facility], LC checked in on mom and mom using the #24 Flanges, left the #24 good fit , the #27 seemed tight at the base of the nipple Per mom comfortable. LC recommended if she felt the #24 was making her sore increase to #27. Mom receptive to [place]    Maternal Data Has patient been taught Hand Expression?: Yes  Feeding    LATCH Score/Interventions          Comfort (Breast/Nipple): Filling, red/small blisters or bruises, mild/mod discomfort (sore nipples , see LC note )  Problem noted:  Cracked, bleeding, blisters, bruises (left worst than right )  Intervention(s): Breastfeeding basics reviewed     Lactation Tools Discussed/Used     Consult Status Consult Status: Follow-up Date: 02/28/14 Follow-up type: Out-patient    IMyer Haff9/16/2015, 10:52 AM

## 2014-02-19 NOTE — Discharge Instructions (Signed)

## 2014-02-28 ENCOUNTER — Ambulatory Visit (HOSPITAL_COMMUNITY): Payer: 59

## 2014-04-02 ENCOUNTER — Encounter: Payer: Self-pay | Admitting: *Deleted

## 2014-04-07 ENCOUNTER — Encounter (HOSPITAL_COMMUNITY): Payer: Self-pay | Admitting: Obstetrics and Gynecology

## 2014-04-08 DIAGNOSIS — N979 Female infertility, unspecified: Secondary | ICD-10-CM | POA: Insufficient documentation

## 2014-06-15 ENCOUNTER — Other Ambulatory Visit: Payer: Self-pay | Admitting: Internal Medicine

## 2014-06-17 IMAGING — US US OB DETAIL+14 WK
1 series · 12 of 28 positions shown · non-contrast
Comparison: none

[Series 1: us ob detail+14 wk · 0.17mm/px · 12 of 120 slices shown]
[im 5/120]
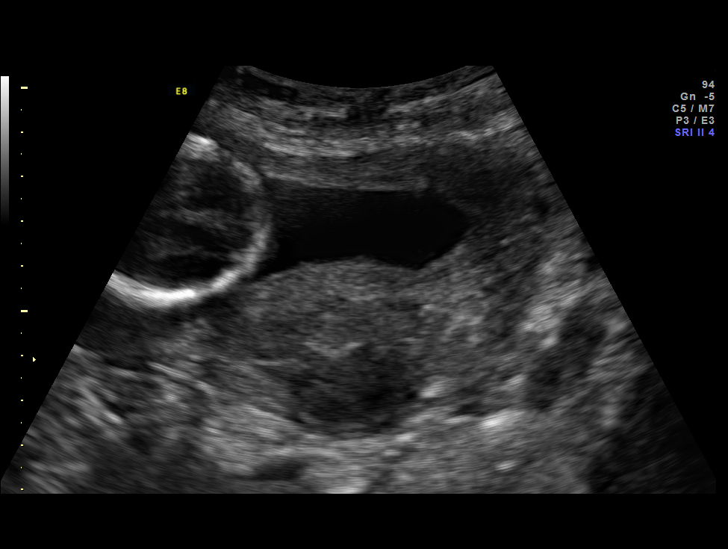
[im 14/120]
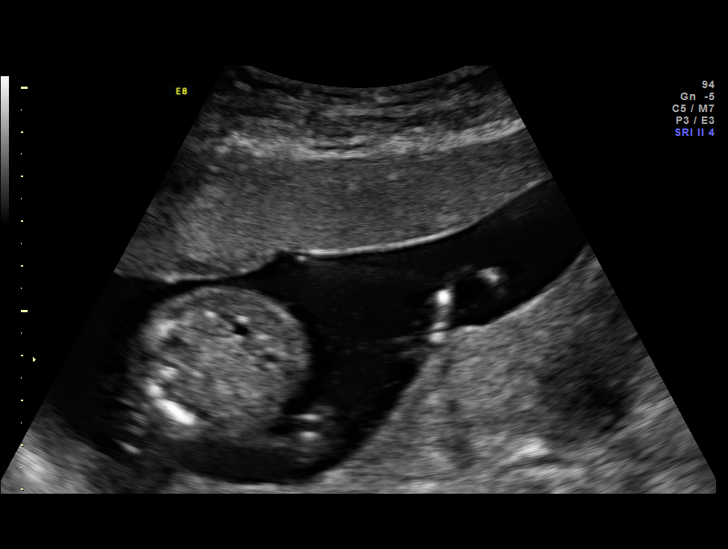
[im 23/120]
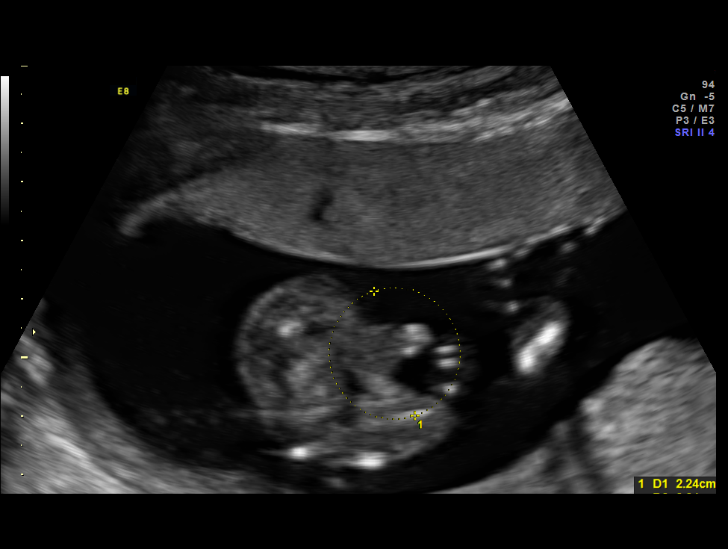
[im 36/120]
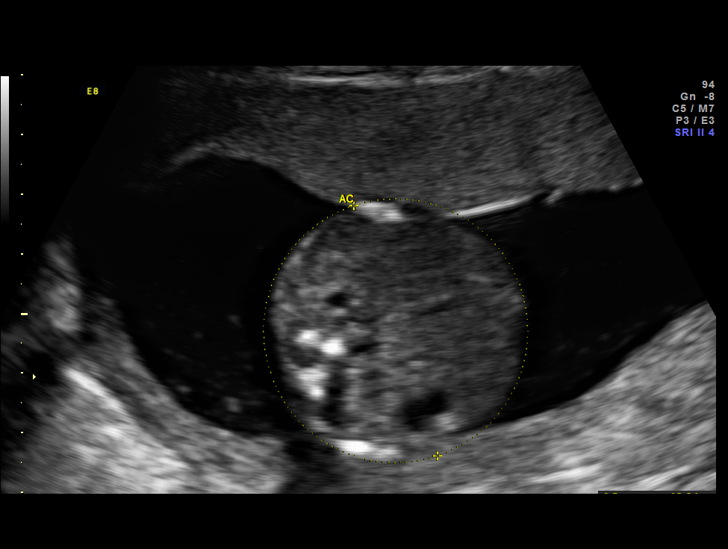
[im 45/120]
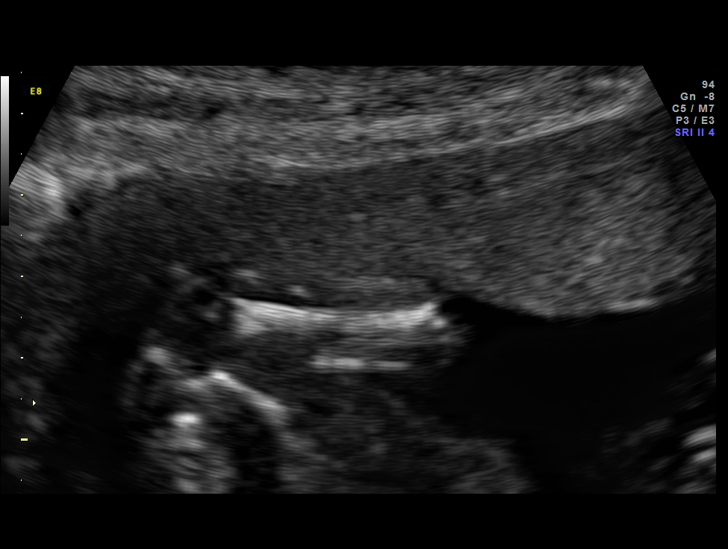
[im 53/120]
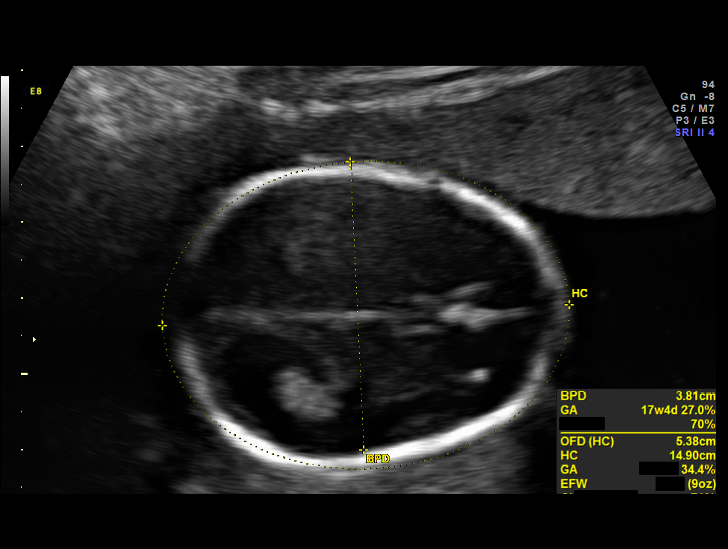
[im 67/120]
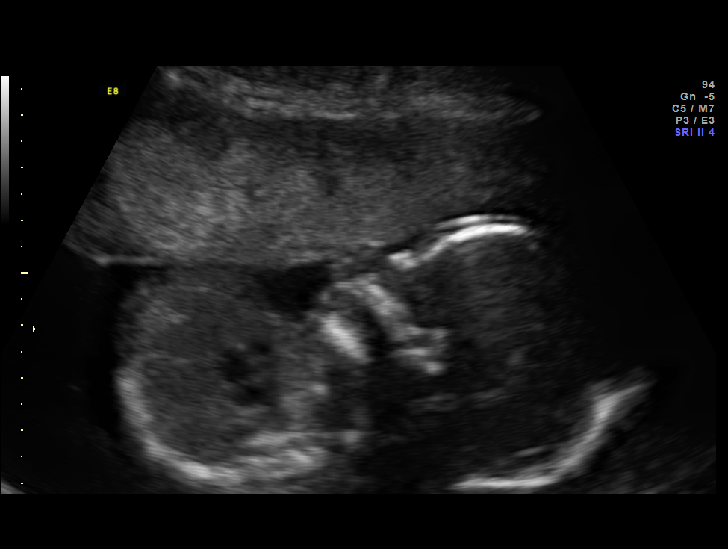
[im 75/120]
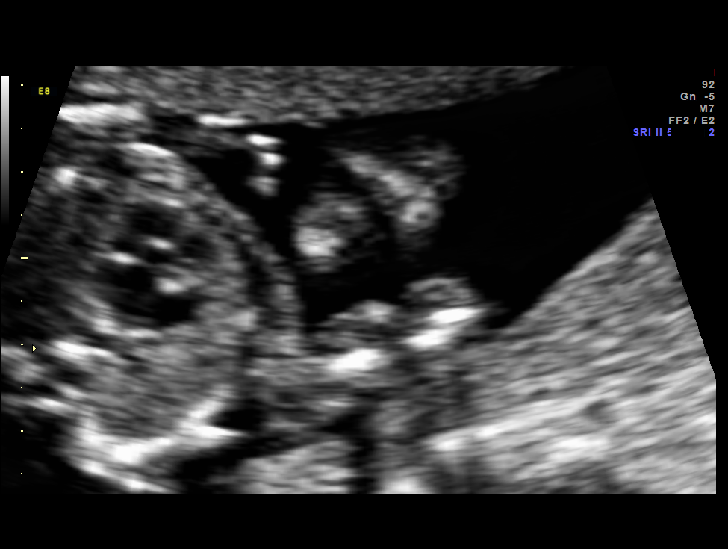
[im 84/120]
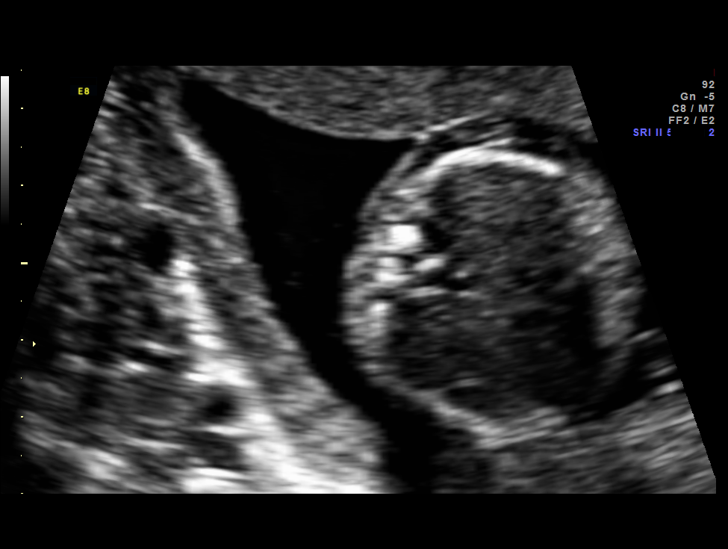
[im 97/120]
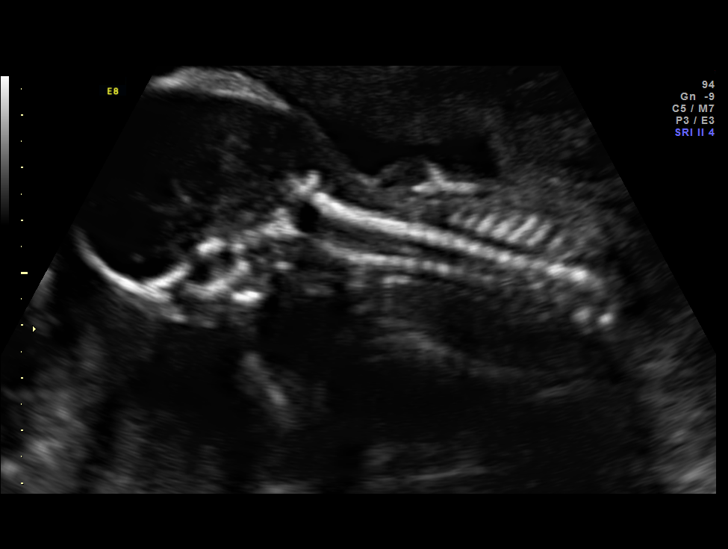
[im 106/120]
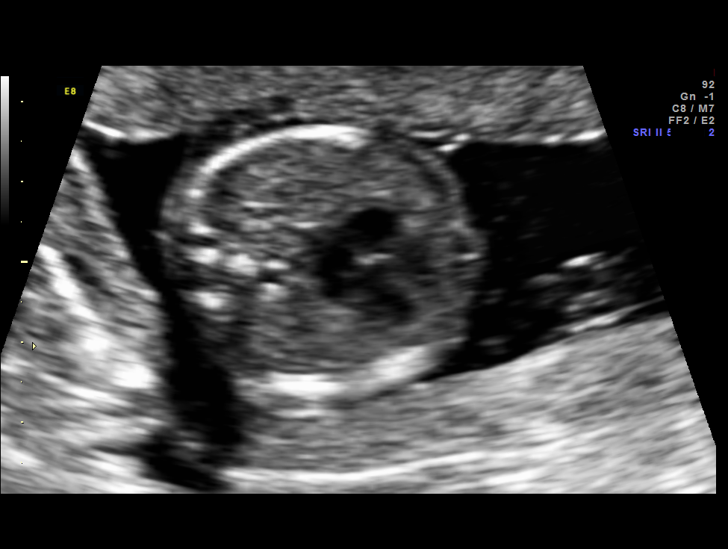
[im 115/120]
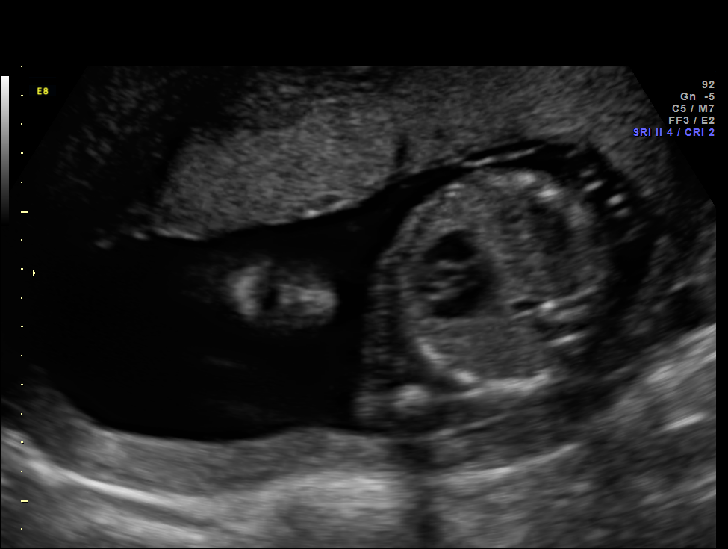

[12 of 28 positions shown; findings below may reference images not displayed]

OBSTETRICS REPORT
                      (Signed Final 09/24/2013 [DATE])

Service(s) Provided

 US OB DETAIL + 14 WK                                  76811.0
Indications

 Detailed fetal anatomic survey
 Advanced maternal age (37), Multigravida; NIPS
 only resulted gender
 Heterozygote for factor V Leiden on ASA
 Medical complication of pregnancy - UC
Fetal Evaluation

 Num Of Fetuses:    1
 Fetal Heart Rate:  148                          bpm
 Cardiac Activity:  Observed
 Presentation:      Variable
 Placenta:          Anterior, low-lying, 1.6 cm
                    from int os
 P. Cord            Visualized
 Insertion:

 Amniotic Fluid
 AFI FV:      Subjectively within normal limits
                                             Larg Pckt:     5.1  cm
Biometry

 BPD:     38.7  mm     G. Age:  17w 6d                CI:         71.7   70 - 86
 OFD:       54  mm                                    FL/HC:      18.2   15.8 -
                                                                         18
 HC:     149.3  mm     G. Age:  18w 0d       36  %    HC/AC:      1.07   1.07 -

 AC:       140  mm     G. Age:  19w 3d       84  %    FL/BPD:
 FL:      27.1  mm     G. Age:  18w 2d       50  %    FL/AC:      19.4   20 - 24
 HUM:       26  mm     G. Age:  18w 1d       57  %
 CER:     20.2  mm     G. Age:  19w 1d       80  %
 NFT:      3.7  mm

 Est. FW:     255  gm      0 lb 9 oz     58  %
Gestational Age

 LMP:           18w 1d        Date:  05/20/13                 EDD:   02/24/14
 U/S Today:     18w 3d                                        EDD:   02/22/14
 Best:          18w 1d     Det. By:  LMP  (05/20/13)          EDD:   02/24/14
Anatomy

 Cranium:          Appears normal         Aortic Arch:      Appears normal
 Fetal Cavum:      Appears normal         Ductal Arch:      Appears normal
 Ventricles:       Appears normal         Diaphragm:        Appears normal
 Choroid Plexus:   Appears normal         Stomach:          Appears normal, left
                                                            sided
 Cerebellum:       Appears normal         Abdomen:          Appears normal
 Posterior Fossa:  Appears normal         Abdominal Wall:   Appears nml (cord
                                                            insert, abd wall)
 Nuchal Fold:      Appears normal         Cord Vessels:     Appears normal (3
                                                            vessel cord)
 Face:             Appears normal         Kidneys:          Appear normal
                   (orbits and profile)
 Lips:             Appears normal         Bladder:          Appears normal
 Heart:            Appears normal         Spine:            Appears normal
                   (4CH, axis, and
                   situs)
 RVOT:             Appears normal         Lower             Appears normal
                                          Extremities:
 LVOT:             Appears normal         Upper             Appears normal
                                          Extremities:

 Other:  Fetus appears to be a male. Heels and 5th digit appear normal.
Targeted Anatomy

 Fetal Central Nervous System
 Cisterna Magna:
Cervix Uterus Adnexa

 Cervical Length:    3        cm

 Cervix:       Normal appearance by transabdominal scan. Appears
               closed, without funnelling.

 Left Ovary:    Within normal limits.
 Right Ovary:   Within normal limits.
Myomas

 Site                     L(cm)      W(cm)       D(cm)      Location
 Posterior                4.6        4

 Blood Flow                  RI       PI       Comments

Impression

 SIUP at 18+1 weeks
 Normal detailed fetal anatomy
 Markers of aneuploidy: none
 Normal amniotic fluid volume
 Measurements consistent with LMP dating
 Low-lying anterior placenta (1.6 cms edge to os)
 Fibroid uterus: see above for size and location
Recommendations

 Follow-up ultrasound in the early third trimeter to reassess
 placenta location

 questions or concerns.

## 2014-06-18 ENCOUNTER — Telehealth: Payer: Self-pay | Admitting: Internal Medicine

## 2014-06-18 MED ORDER — MESALAMINE 1.2 G PO TBEC: 1.2000 g | DELAYED_RELEASE_TABLET | Freq: Every day | ORAL | Status: DC

## 2014-06-18 NOTE — Telephone Encounter (Signed)
Rx sent 

## 2014-08-20 ENCOUNTER — Other Ambulatory Visit (INDEPENDENT_AMBULATORY_CARE_PROVIDER_SITE_OTHER): Payer: 59

## 2014-08-20 ENCOUNTER — Encounter: Payer: Self-pay | Admitting: Internal Medicine

## 2014-08-20 ENCOUNTER — Ambulatory Visit (INDEPENDENT_AMBULATORY_CARE_PROVIDER_SITE_OTHER): Payer: 59 | Admitting: Internal Medicine

## 2014-08-20 VITALS — BP 100/50 | HR 100 | Ht 63.25 in | Wt 117.5 lb

## 2014-08-20 DIAGNOSIS — K51919 Ulcerative colitis, unspecified with unspecified complications: Secondary | ICD-10-CM

## 2014-08-20 DIAGNOSIS — K219 Gastro-esophageal reflux disease without esophagitis: Secondary | ICD-10-CM

## 2014-08-20 DIAGNOSIS — E538 Deficiency of other specified B group vitamins: Secondary | ICD-10-CM

## 2014-08-20 LAB — CBC WITH DIFFERENTIAL/PLATELET
Basophils Absolute: 0.1 10*3/uL (ref 0.0–0.1)
Basophils Relative: 1 % (ref 0.0–3.0)
EOS ABS: 0.3 10*3/uL (ref 0.0–0.7)
Eosinophils Relative: 3.7 % (ref 0.0–5.0)
HEMATOCRIT: 39.3 % (ref 36.0–46.0)
Hemoglobin: 13.2 g/dL (ref 12.0–15.0)
LYMPHS ABS: 2.3 10*3/uL (ref 0.7–4.0)
Lymphocytes Relative: 24.5 % (ref 12.0–46.0)
MCHC: 33.7 g/dL (ref 30.0–36.0)
MCV: 93.6 fl (ref 78.0–100.0)
MONOS PCT: 5.9 % (ref 3.0–12.0)
Monocytes Absolute: 0.5 10*3/uL (ref 0.1–1.0)
NEUTROS PCT: 64.9 % (ref 43.0–77.0)
Neutro Abs: 6 10*3/uL (ref 1.4–7.7)
Platelets: 341 10*3/uL (ref 150.0–400.0)
RBC: 4.2 Mil/uL (ref 3.87–5.11)
RDW: 14.5 % (ref 11.5–15.5)
WBC: 9.3 10*3/uL (ref 4.0–10.5)

## 2014-08-20 LAB — COMPREHENSIVE METABOLIC PANEL
ALK PHOS: 52 U/L (ref 39–117)
ALT: 19 U/L (ref 0–35)
AST: 22 U/L (ref 0–37)
Albumin: 4.5 g/dL (ref 3.5–5.2)
BILIRUBIN TOTAL: 0.4 mg/dL (ref 0.2–1.2)
BUN: 19 mg/dL (ref 6–23)
CO2: 29 meq/L (ref 19–32)
CREATININE: 0.77 mg/dL (ref 0.40–1.20)
Calcium: 9.4 mg/dL (ref 8.4–10.5)
Chloride: 101 mEq/L (ref 96–112)
GFR: 88.43 mL/min (ref >60.00)
GLUCOSE: 89 mg/dL (ref 70–99)
POTASSIUM: 4 meq/L (ref 3.5–5.1)
SODIUM: 135 meq/L (ref 135–145)
TOTAL PROTEIN: 7.6 g/dL (ref 6.0–8.3)

## 2014-08-20 LAB — HIGH SENSITIVITY CRP: CRP, High Sensitivity: 0.71 mg/L (ref 0.000–5.000)

## 2014-08-20 MED ORDER — MESALAMINE 1000 MG RE SUPP
1000.0000 mg | Freq: Every day | RECTAL | Status: DC
Start: 2014-08-20 — End: 2014-12-15

## 2014-08-20 MED ORDER — MESALAMINE 1.2 G PO TBEC: 2.4000 g | DELAYED_RELEASE_TABLET | Freq: Every day | ORAL | Status: DC

## 2014-08-20 NOTE — Patient Instructions (Addendum)
We have sent the following medications to your pharmacy for you to pick up at your convenience: Lialda 2.4 grams daily (increase from 1.2 grams daily) Canasa suppositories-1 per rectum every night x 8 weeks  Your physician has requested that you go to the basement for the following lab work before leaving today: CBC, CMP, CRP, B12, Vitamin D  You will be due for a recall endoscopy and colonoscopy in 12/05/14. We will send you a reminder in the mail when it gets closer to that time.  Please call our office the 1st week of April if your active colitis symptoms do not resolve. If not resolved at that time, we may give a trial of Uceris.  You have been scheduled for a bone density test on Wednesday, 08/27/14 at 1:00 pm. Please go to radiology on the basement floor of [facility] for this test. No preparation is necessary.

## 2014-08-21 LAB — VITAMIN D 25 HYDROXY (VIT D DEFICIENCY, FRACTURES): VITD: 25.26 ng/mL — AB (ref 30.00–100.00)

## 2014-08-21 LAB — VITAMIN B12: VITAMIN B 12: 1057 pg/mL — AB (ref 211–911)

## 2014-08-21 NOTE — Progress Notes (Signed)
Subjective:    Patient ID: Anna Berger, female    DOB: 1976/04/06, 39 y.o.   MRN: [id number]  HPI Anna Berger is a 39 year old female with past medical history of pan-ulcerative colitis diagnosed at age 5, GERD, vitamin D and B12 deficiency, hyperlipidemia, factor V Leiden deficiency who is seen in follow-up. She was last seen one year ago. At that time she was pregnant and she had her son 6 months ago. She reports today she is having signs and symptoms consistent with active flare. This is diarrhea with mucus and occasional blood. Bowel movements tend 15 times a day. No recent antibiotics. She has lower abdominal cramping pain. Denies tenesmus. No fevers or chills. She reports due to her busy schedule she at times has forgotten to take her Lialda. She is also not recently been using Canasa. She is using over-the-counter Nexium 20 mg daily for heartburn and this controls her GERD symptoms well. She has lost weight that she is unsure how much of this is "baby weight". Denies hepatobiliary complaint, rashes, new joint pains or ocular complaints  Review of Systems As per history of present illness, otherwise negative  Current Medications, Allergies, Past Medical History, Past Surgical History, Family History and Social History were reviewed in [employer] record.     Objective:   Physical Exam BP 100/50 mmHg  Pulse 100  Ht 5' 3.25" (1.607 m)  Wt 117 lb 8 oz (53.298 kg)  BMI 20.64 kg/m2  LMP 08/04/2014  Breastfeeding? No Constitutional: Well-developed and well-nourished. No distress. HEENT: Normocephalic and atraumatic. Oropharynx is clear and moist. No oropharyngeal exudate. Conjunctivae are normal.  No scleral icterus. Neck: Neck supple. Trachea midline. Cardiovascular: Normal rate, regular rhythm and intact distal pulses. No M/R/G Pulmonary/chest: Effort normal and breath sounds normal. No wheezing, rales or rhonchi. Abdominal: Soft, mild mid and lower abdominal  tenderness without rebound or guarding, nondistended. Bowel sounds active throughout. There are no masses palpable. No hepatosplenomegaly. Extremities: no clubbing, cyanosis, or edema Lymphadenopathy: No cervical adenopathy noted. Neurological: Alert and oriented to person place and time. Skin: Skin is warm and dry. No rashes noted. Psychiatric: Normal mood and affect. Behavior is normal.  CBC    Component Value Date/Time   WBC 9.3 08/20/2014 1654   RBC 4.20 08/20/2014 1654   HGB 13.2 08/20/2014 1654   HCT 39.3 08/20/2014 1654   PLT 341.0 08/20/2014 1654   MCV 93.6 08/20/2014 1654   MCH 32.6 02/18/2014 0601   MCHC 33.7 08/20/2014 1654   RDW 14.5 08/20/2014 1654   LYMPHSABS 2.3 08/20/2014 1654   MONOABS 0.5 08/20/2014 1654   EOSABS 0.3 08/20/2014 1654   BASOSABS 0.1 08/20/2014 1654    CMP     Component Value Date/Time   NA 135 08/20/2014 1654   K 4.0 08/20/2014 1654   CL 101 08/20/2014 1654   CO2 29 08/20/2014 1654   GLUCOSE 89 08/20/2014 1654   BUN 19 08/20/2014 1654   CREATININE 0.77 08/20/2014 1654   CALCIUM 9.4 08/20/2014 1654   PROT 7.6 08/20/2014 1654   ALBUMIN 4.5 08/20/2014 1654   AST 22 08/20/2014 1654   ALT 19 08/20/2014 1654   ALKPHOS 52 08/20/2014 1654   BILITOT 0.4 08/20/2014 1654   GFRNONAA >90 02/17/2014 1955   GFRAA >90 02/17/2014 1955    Lab Results  Component Value Date   [id number] 7340* 08/20/2014       Colonoscopy 01/14/2013 -- foreshortened colon with scarring in the upper colon.  Active proctitis 0-50 cm; biopsies for dysplasia. Pathology = right colon and left colon benign colonic mucosa. Negative for active inflammation, granulomas, dysplasia and malignancy. Rectal biopsy chronic active colitis consistent with IBD. Pathologist comment that histologic features consistent with IBD and favor ulcerative colitis Assessment & Plan:  39 year old female with past medical history of pan-ulcerative colitis diagnosed at age 48, GERD, vitamin D and  B12 deficiency, hyperlipidemia, factor V Leiden deficiency who is seen in follow-up.  1. Pan-UC with flare -- long-standing history of ulcerative colitis. Current flare likely secondary to inconsistent use of mesalamine. In the past she has taken 1.2 g a day because 2.4 g a day caused constipation. I recommended 4.8 g per day but she feels this would be impossible for her to tolerate. With that in mind will increase her Lialda 2.4 g daily and add back Canasa 1000 mg at bedtime. Would like her to use the mesalamine suppository for at least 8 weeks. If symptoms not completely improved by the first week in April I've asked that she notify me. This is the case would likely prescribe colonic release budesonide.  --Lialda 2.4 g daily, Canasa 1000 g daily at bedtime --Bone mineral density order today --Surveillance colonoscopy summer 2016  2. B12 deficiency -- getting IM B12. B12 today normal  3. GERD -- long-standing and well controlled on Nexium at this time. Continue 20 g daily. Upper endoscopy at patient request which is reasonable getting long-standing GERD. Will exclude Barrett's.  Return in 3 months, sooner if necessary

## 2014-08-25 ENCOUNTER — Other Ambulatory Visit: Payer: Self-pay | Admitting: Internal Medicine

## 2014-08-27 ENCOUNTER — Inpatient Hospital Stay: Admission: RE | Admit: 2014-08-27 | Payer: 59 | Source: Ambulatory Visit

## 2014-09-04 ENCOUNTER — Ambulatory Visit (INDEPENDENT_AMBULATORY_CARE_PROVIDER_SITE_OTHER)
Admission: RE | Admit: 2014-09-04 | Discharge: 2014-09-04 | Disposition: A | Discharge status: Home / Self Care | Payer: 59 | Source: Ambulatory Visit | Attending: Internal Medicine | Admitting: Internal Medicine

## 2014-09-04 DIAGNOSIS — K51919 Ulcerative colitis, unspecified with unspecified complications: Secondary | ICD-10-CM | POA: Diagnosis not present

## 2014-10-21 ENCOUNTER — Encounter: Payer: Self-pay | Admitting: Internal Medicine

## 2014-12-05 ENCOUNTER — Other Ambulatory Visit: Payer: Self-pay | Admitting: Internal Medicine

## 2014-12-10 ENCOUNTER — Other Ambulatory Visit: Payer: Self-pay | Admitting: Internal Medicine

## 2014-12-15 ENCOUNTER — Ambulatory Visit (AMBULATORY_SURGERY_CENTER): Payer: 59 | Admitting: *Deleted

## 2014-12-15 VITALS — Ht 63.0 in | Wt 112.0 lb

## 2014-12-15 DIAGNOSIS — K51919 Ulcerative colitis, unspecified with unspecified complications: Secondary | ICD-10-CM

## 2014-12-15 DIAGNOSIS — K219 Gastro-esophageal reflux disease without esophagitis: Secondary | ICD-10-CM

## 2014-12-15 MED ORDER — NA SULFATE-K SULFATE-MG SULF 17.5-3.13-1.6 GM/177ML PO SOLN: 1.0000 | Freq: Once | ORAL | Status: DC

## 2014-12-15 NOTE — Progress Notes (Signed)
No egg or soy allergy Wakes up with colon sedation in the past, no other issues  No diet pills No home 02 emmi video declined

## 2014-12-29 ENCOUNTER — Encounter: Payer: Self-pay | Admitting: Internal Medicine

## 2014-12-29 ENCOUNTER — Ambulatory Visit (AMBULATORY_SURGERY_CENTER): Payer: 59 | Admitting: Internal Medicine

## 2014-12-29 ENCOUNTER — Encounter: Payer: Self-pay | Admitting: *Deleted

## 2014-12-29 VITALS — BP 121/64 | HR 64 | Temp 98.8°F | Resp 33 | Ht 63.0 in | Wt 112.0 lb

## 2014-12-29 DIAGNOSIS — D120 Benign neoplasm of cecum: Secondary | ICD-10-CM

## 2014-12-29 DIAGNOSIS — K219 Gastro-esophageal reflux disease without esophagitis: Secondary | ICD-10-CM | POA: Diagnosis not present

## 2014-12-29 DIAGNOSIS — K635 Polyp of colon: Secondary | ICD-10-CM | POA: Diagnosis not present

## 2014-12-29 DIAGNOSIS — K5190 Ulcerative colitis, unspecified, without complications: Secondary | ICD-10-CM | POA: Diagnosis not present

## 2014-12-29 MED ORDER — SODIUM CHLORIDE 0.9 % IV SOLN: 500.0000 mL | INTRAVENOUS | Status: DC

## 2014-12-29 NOTE — Op Note (Signed)
[facility]  [address]. [place], [address]   ENDOSCOPY PROCEDURE REPORT  PATIENT: Karn, Derk  MR#: [id number] BIRTHDATE: 03/11/1976 , 38  yrs. old GENDER: female ENDOSCOPIST: Jerene Bears, MD PROCEDURE DATE:  12/29/2014 PROCEDURE:  EGD, diagnostic ASA CLASS:     Class II INDICATIONS:  history of GERD. MEDICATIONS: Monitored anesthesia care and Propofol 200 mg IV TOPICAL ANESTHETIC: none  DESCRIPTION OF PROCEDURE: After the risks benefits and alternatives of the procedure were thoroughly explained, informed consent was obtained.  The LB [id number] endoscope was introduced through the mouth and advanced to the second portion of the duodenum , Without limitations.  The instrument was slowly withdrawn as the mucosa was fully examined.   Images not obtained due to software down time     EXAM: The esophagus and gastroesophageal junction were completely normal in appearance.  The stomach was entered and closely examined.The antrum, angularis, and lesser curvature were well visualized, including a retroflexed view of the cardia and fundus. The stomach wall was normally distensible.  The scope passed easily through the pylorus into the duodenum.  Retroflexed views revealed no abnormalities.     The scope was then withdrawn from the patient and the procedure completed.  COMPLICATIONS: There were no immediate complications.  ENDOSCOPIC IMPRESSION: Normal EGD  RECOMMENDATIONS: 1.  Continue current medications 2.  Proceed with surveillance colonoscopy.   eSigned:  Jerene Bears, MD 12/29/2014 9:51 AM    [id number], Anderson Malta MD and The Patient

## 2014-12-29 NOTE — Progress Notes (Signed)
Patient ID: Anna Berger, female   DOB: 10-Jan-1976, 39 y.o.   MRN: [id number] Procedure report mailed to pt

## 2014-12-29 NOTE — Progress Notes (Signed)
Called to room to assist during endoscopic procedure.  Patient ID and intended procedure confirmed with present staff. Received instructions for my participation in the procedure from the performing physician.  

## 2014-12-29 NOTE — Progress Notes (Signed)
Report to PACU, RN, vss, BBS= Clear.  

## 2014-12-29 NOTE — Op Note (Addendum)
[facility]  [address]. [place], [address]   COLONOSCOPY PROCEDURE REPORT  PATIENT: Anna Berger, Anna Berger  MR#: [id number] BIRTHDATE: Feb 20, 1976 , 38  yrs. old GENDER: female ENDOSCOPIST: Jerene Bears, MD PROCEDURE DATE:  12/29/2014 PROCEDURE:   Colonoscopy, surveillance , Colonoscopy with snare polypectomy, and Colonoscopy with biopsy First Screening Colonoscopy - Avg.  risk and is 50 yrs.  old or older - No.  Prior Negative Screening - Now for repeat screening. N/A  History of Adenoma - Now for follow-up colonoscopy & has been > or = to 3 yrs.  N/A  Polyps removed today? Yes ASA CLASS:   Class II INDICATIONS: Surveillance colonoscopy given long-standing ulcerative colitis, last colonoscopy 2 years ago by Dr. Sharlett Iles . MEDICATIONS: Monitored anesthesia care, Propofol 400 mg IV, and this was the total dose used for all procedures at this session  DESCRIPTION OF PROCEDURE:   After the risks benefits and alternatives of the procedure were thoroughly explained, informed consent was obtained.  The digital rectal exam revealed no rectal mass.   The LB [id number]  endoscope was introduced through the anus and advanced to the cecum, which was identified by both the appendix and ileocecal valve. No adverse events experienced. The quality of the prep was (Suprep was used) good.  The instrument was then slowly withdrawn as the colon was fully examined. Estimated blood loss is zero unless otherwise noted in this procedure report.     COLON FINDINGS: A sessile polyp measuring 5 mm in size was found at the cecum.  A polypectomy was performed with a cold snare.  The resection was complete, the polyp tissue was completely retrieved and sent to histology.   Shortened colon with mucosal scarring, loss of normal folds due to long-standing ulcerative colitis.  The colon has a tubular appearance with loss of normal haustral pattern.  There was no evidence of active colitis  but definite evidence of prior inflammation throughout the colon.  Narrow band imaging was used for mucosal examination.  Multiple surveillance biopsies were obtained throughout the colon every 8-10 cm and placed in separate jars (right colon, left colon, rectum). Retroflexion was not performed due to a narrow rectal vault. The time to cecum = 2.1 Withdrawal time = 13.9   The scope was withdrawn and the procedure completed.  COMPLICATIONS: There were no immediate complications.  ENDOSCOPIC IMPRESSION: 1.   Sessile polyp was found at the cecum; polypectomy was performed with a cold snare 2.   Changes of chronic colitis without evidence of active colitis endoscopically; Multiple surveillance biopsies were obtained throughout the colon every 8-10 cm and placed in separate jars (right colon, left colon, rectum)  RECOMMENDATIONS: 1.  Await pathology results 2.  Continue Lialda 2.4 g daily 3.  Office follow-up in 6 months, sooner if needed 4.  Repeat colonoscopy based on pathology results, but no longer than 24 months given chronic ulcerative colitis of long-standing nature  eSigned:  Jerene Bears, MD 12/29/2014 10:04 AM Revised: 12/29/2014 10:04 AM  cc: The Patient and Carlos Levering MD

## 2014-12-29 NOTE — Patient Instructions (Signed)
Discharge instructions given. Handout on polyps. Biopsies taken. Resume previous medications. YOU HAD AN ENDOSCOPIC PROCEDURE TODAY AT [facility]:   Refer to the procedure report that was given to you for any specific questions about what was found during the examination.  If the procedure report does not answer your questions, please call your gastroenterologist to clarify.  If you requested that your care partner not be given the details of your procedure findings, then the procedure report has been included in a sealed envelope for you to review at your convenience later.  YOU SHOULD EXPECT: Some feelings of bloating in the abdomen. Passage of more gas than usual.  Walking can help get rid of the air that was put into your GI tract during the procedure and reduce the bloating. If you had a lower endoscopy (such as a colonoscopy or flexible sigmoidoscopy) you may notice spotting of blood in your stool or on the toilet paper. If you underwent a bowel prep for your procedure, you may not have a normal bowel movement for a few days.  Please Note:  You might notice some irritation and congestion in your nose or some drainage.  This is from the oxygen used during your procedure.  There is no need for concern and it should clear up in a day or so.  SYMPTOMS TO REPORT IMMEDIATELY:   Following lower endoscopy (colonoscopy or flexible sigmoidoscopy):  Excessive amounts of blood in the stool  Significant tenderness or worsening of abdominal pains  Swelling of the abdomen that is new, acute  Fever of 100F or higher   Following upper endoscopy (EGD)  Vomiting of blood or coffee ground material  New chest pain or pain under the shoulder blades  Painful or persistently difficult swallowing  New shortness of breath  Fever of 100F or higher  Black, tarry-looking stools  For urgent or emergent issues, a gastroenterologist can be reached at any hour by calling [phone]  [phone].   DIET: Your first meal following the procedure should be a small meal and then it is ok to progress to your normal diet. Heavy or fried foods are harder to digest and may make you feel nauseous or bloated.  Likewise, meals heavy in dairy and vegetables can increase bloating.  Drink plenty of fluids but you should avoid alcoholic beverages for 24 hours.  ACTIVITY:  You should plan to take it easy for the rest of today and you should NOT DRIVE or use heavy machinery until tomorrow (because of the sedation medicines used during the test).    FOLLOW UP: Our staff will call the number listed on your records the next business day following your procedure to check on you and address any questions or concerns that you may have regarding the information given to you following your procedure. If we do not reach you, we will leave a message.  However, if you are feeling well and you are not experiencing any problems, there is no need to return our call.  We will assume that you have returned to your regular daily activities without incident.  If any biopsies were taken you will be contacted by phone or by letter within the next 1-3 weeks.  Please call us at [phone] if you have not heard about the biopsies in 3 weeks.    SIGNATURES/CONFIDENTIALITY: You and/or your care partner have signed paperwork which will be entered into your electronic medical record.  These signatures attest to the fact that that  the information above on your After Visit Summary has been reviewed and is understood.  Full responsibility of the confidentiality of this discharge information lies with you and/or your care-partner.

## 2014-12-30 ENCOUNTER — Telehealth: Payer: Self-pay | Admitting: *Deleted

## 2014-12-30 NOTE — Telephone Encounter (Signed)
Number identifier, left message, follow-up

## 2015-01-05 ENCOUNTER — Encounter: Payer: Self-pay | Admitting: Internal Medicine

## 2015-02-22 ENCOUNTER — Other Ambulatory Visit: Payer: Self-pay | Admitting: Internal Medicine

## 2015-03-14 ENCOUNTER — Other Ambulatory Visit: Payer: Self-pay | Admitting: Internal Medicine

## 2015-03-17 ENCOUNTER — Other Ambulatory Visit: Payer: Self-pay | Admitting: Internal Medicine

## 2015-05-18 ENCOUNTER — Encounter: Payer: Self-pay | Admitting: Internal Medicine

## 2015-07-11 ENCOUNTER — Other Ambulatory Visit: Payer: Self-pay | Admitting: Internal Medicine

## 2015-07-15 ENCOUNTER — Other Ambulatory Visit: Payer: Self-pay | Admitting: Internal Medicine

## 2015-08-11 ENCOUNTER — Other Ambulatory Visit (INDEPENDENT_AMBULATORY_CARE_PROVIDER_SITE_OTHER): Payer: BLUE CROSS/BLUE SHIELD

## 2015-08-11 ENCOUNTER — Encounter: Payer: Self-pay | Admitting: Internal Medicine

## 2015-08-11 ENCOUNTER — Ambulatory Visit (INDEPENDENT_AMBULATORY_CARE_PROVIDER_SITE_OTHER): Payer: BLUE CROSS/BLUE SHIELD | Admitting: Internal Medicine

## 2015-08-11 VITALS — BP 110/70 | HR 84 | Ht 63.0 in | Wt 109.4 lb

## 2015-08-11 DIAGNOSIS — E538 Deficiency of other specified B group vitamins: Secondary | ICD-10-CM

## 2015-08-11 DIAGNOSIS — K51919 Ulcerative colitis, unspecified with unspecified complications: Secondary | ICD-10-CM

## 2015-08-11 DIAGNOSIS — K5100 Ulcerative (chronic) pancolitis without complications: Secondary | ICD-10-CM | POA: Diagnosis not present

## 2015-08-11 DIAGNOSIS — K219 Gastro-esophageal reflux disease without esophagitis: Secondary | ICD-10-CM

## 2015-08-11 LAB — COMPREHENSIVE METABOLIC PANEL
ALK PHOS: 56 U/L (ref 39–117)
ALT: 18 U/L (ref 0–35)
AST: 25 U/L (ref 0–37)
Albumin: 4.3 g/dL (ref 3.5–5.2)
BILIRUBIN TOTAL: 0.4 mg/dL (ref 0.2–1.2)
BUN: 17 mg/dL (ref 6–23)
CALCIUM: 9.5 mg/dL (ref 8.4–10.5)
CO2: 25 mEq/L (ref 19–32)
Chloride: 102 mEq/L (ref 96–112)
Creatinine, Ser: 0.81 mg/dL (ref 0.40–1.20)
GFR: 83 mL/min (ref >60.00)
GLUCOSE: 99 mg/dL (ref 70–99)
POTASSIUM: 4.3 meq/L (ref 3.5–5.1)
Sodium: 138 mEq/L (ref 135–145)
TOTAL PROTEIN: 7.5 g/dL (ref 6.0–8.3)

## 2015-08-11 LAB — CBC WITH DIFFERENTIAL/PLATELET
BASOS ABS: 0.1 10*3/uL (ref 0.0–0.1)
Basophils Relative: 0.6 % (ref 0.0–3.0)
EOS PCT: 4.2 % (ref 0.0–5.0)
Eosinophils Absolute: 0.5 10*3/uL (ref 0.0–0.7)
HEMATOCRIT: 38.6 % (ref 36.0–46.0)
Hemoglobin: 12.8 g/dL (ref 12.0–15.0)
LYMPHS ABS: 2.4 10*3/uL (ref 0.7–4.0)
LYMPHS PCT: 19.1 % (ref 12.0–46.0)
MCHC: 33.2 g/dL (ref 30.0–36.0)
MCV: 93.8 fl (ref 78.0–100.0)
MONOS PCT: 5.5 % (ref 3.0–12.0)
Monocytes Absolute: 0.7 10*3/uL (ref 0.1–1.0)
NEUTROS ABS: 8.7 10*3/uL — AB (ref 1.4–7.7)
NEUTROS PCT: 70.6 % (ref 43.0–77.0)
PLATELETS: 394 10*3/uL (ref 150.0–400.0)
RBC: 4.12 Mil/uL (ref 3.87–5.11)
RDW: 13.8 % (ref 11.5–15.5)
WBC: 12.4 10*3/uL — ABNORMAL HIGH (ref 4.0–10.5)

## 2015-08-11 LAB — VITAMIN B12: Vitamin B-12: 910 pg/mL (ref 211–911)

## 2015-08-11 MED ORDER — MESALAMINE 1.2 G PO TBEC: 2.4000 g | DELAYED_RELEASE_TABLET | Freq: Every day | ORAL | Status: DC

## 2015-08-11 NOTE — Patient Instructions (Addendum)
Your physician has requested that you go to the basement for the following lab work before leaving today: B12, CMET, CBC  Please continue Lialda 2 tablets daily.  Continue Canasa every night as needed.  Follow up with Dr Hilarie Fredrickson in 1 year.

## 2015-08-11 NOTE — Progress Notes (Signed)
   Subjective:    Patient ID: Anna Berger, female    DOB: 19-Jun-1975, 40 y.o.   MRN: [id number]  HPI Anna Berger is a 40 year old female with history of long-standing panulcerative colitis, GERD, vitamin D and B12 deficiency, factor V Leiden deficiency who is here for follow-up. She is here alone today and was last in the office one year ago. She came for surveillance colonoscopy which was performed on 12/29/2014. Colonoscopy revealed a sessile cecal polyp removed with cold snare. There was changes of chronic colitis without evidence of active colitis throughout the colon. Surveillance biopsies were obtained. Pathology revealed benign polypoid colonic mucosa from the cecal polyp. Right and left colon showed benign colonic mucosa without abnormality and in other areas mild architectural distortion suggestive of quiescent colitis. 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 Upper endoscopy was performed on the same day and normal.  She reports she is doing very very well on Lialda 2.4 g daily. She has had no flares this year and denies abdominal pain. No rashes. No joint pains. No blood in stool or melena. She occasionally uses Canasa for several days if she feels mild tenesmus. No tenesmus recently. No fevers or chills. Good appetite. No hepatobiliary complaint. Occasional oral ulcers which are rare and heal quickly. She does continue on Nexium 20 mg a day for her chronic heartburn. When stopping heartburn returns.   Review of Systems As per history of present illness, otherwise negative  Current Medications, Allergies, Past Medical History, Past Surgical History, Family History and Social History were reviewed in [employer] record.     Objective:   Physical Exam BP 110/70 mmHg  Pulse 84  Ht 5' 3"  (1.6 m)  Wt 109 lb 6 oz (49.612 kg)  BMI 19.38 kg/m2  LMP 07/30/2015  Breastfeeding? No Constitutional: Well-developed and well-nourished. No distress. HEENT:  Normocephalic and atraumatic. Oropharynx is clear and moist. No oropharyngeal exudate. Conjunctivae are normal.  No scleral icterus. Neck: Neck supple. Trachea midline. Cardiovascular: Normal rate, regular rhythm and intact distal pulses. No M/R/G Pulmonary/chest: Effort normal and breath sounds normal. No wheezing, rales or rhonchi. Abdominal: Soft, nontender, nondistended. Bowel sounds active throughout. There are no masses palpable. No hepatosplenomegaly. Extremities: no clubbing, cyanosis, or edema Neurological: Alert and oriented to person place and time. Skin: Skin is warm and dry. No rashes noted. Psychiatric: Normal mood and affect. Behavior is normal.     Assessment & Plan:  40 year old female with history of long-standing panulcerative colitis, GERD, vitamin D and B12 deficiency, factor V Leiden deficiency who is here for follow-up.  1. Pan-UC -- She is doing very well now with clinical remission. Very mild proctitis, histologic only, at colonoscopy in July 2016. We will continue Lialda 2.4 g daily. No recent prednisone. Normal bone scan last year. Up-to-date on flu vaccine and Pneumovax. She attempted to get shingles vaccine but could not afford it. --Check CBC, CMP and B12 today --Surveillance colonoscopy recommended July 2018 --1 year follow-up  2. GERD -- well-controlled on low-dose Nexium. Symptoms return despite attempt to stop this medication. She will continue Nexium 20 mg daily. No Barrett's esophagus at recent endoscopy  3. B12 deficiency -- continue IM B12 once monthly. Check B12 today

## 2015-10-10 ENCOUNTER — Other Ambulatory Visit: Payer: Self-pay | Admitting: Internal Medicine

## 2016-05-24 ENCOUNTER — Other Ambulatory Visit: Payer: Self-pay | Admitting: Internal Medicine

## 2016-05-26 ENCOUNTER — Telehealth: Payer: Self-pay | Admitting: Internal Medicine

## 2016-05-26 NOTE — Telephone Encounter (Signed)
Patient advised that Anna Berger has gone generic and we have no savings cards. I suggested she look online to see if they have any available still. She verbalizes understanding.

## 2016-08-10 ENCOUNTER — Other Ambulatory Visit: Payer: Self-pay | Admitting: Internal Medicine

## 2016-08-13 ENCOUNTER — Other Ambulatory Visit: Payer: Self-pay | Admitting: Internal Medicine

## 2016-08-15 ENCOUNTER — Telehealth: Payer: Self-pay | Admitting: Internal Medicine

## 2016-08-15 MED ORDER — MESALAMINE 1.2 G PO TBEC: 2.4000 g | DELAYED_RELEASE_TABLET | Freq: Every day | ORAL | 1 refills | Status: DC

## 2016-08-15 MED ORDER — CYANOCOBALAMIN 1000 MCG/ML IJ SOLN: 1000.0000 ug | INTRAMUSCULAR | 0 refills | Status: DC

## 2016-08-15 NOTE — Telephone Encounter (Signed)
Patient has been advised that she needs office visit for refills of medications. She has scheduled an appointment for 09/30/16 @ 10 am for follow up of Ulcerative Colitis. I have sent refills of Lialda and b12 until her appointment. She verbalizes understanding.

## 2016-09-20 ENCOUNTER — Other Ambulatory Visit: Payer: Self-pay | Admitting: Internal Medicine

## 2016-09-30 ENCOUNTER — Ambulatory Visit (INDEPENDENT_AMBULATORY_CARE_PROVIDER_SITE_OTHER): Payer: BLUE CROSS/BLUE SHIELD | Admitting: Internal Medicine

## 2016-09-30 ENCOUNTER — Other Ambulatory Visit (INDEPENDENT_AMBULATORY_CARE_PROVIDER_SITE_OTHER): Payer: BLUE CROSS/BLUE SHIELD

## 2016-09-30 ENCOUNTER — Encounter: Payer: Self-pay | Admitting: Internal Medicine

## 2016-09-30 VITALS — BP 100/70 | Ht 63.0 in | Wt 120.0 lb

## 2016-09-30 DIAGNOSIS — E559 Vitamin D deficiency, unspecified: Secondary | ICD-10-CM

## 2016-09-30 DIAGNOSIS — K5100 Ulcerative (chronic) pancolitis without complications: Secondary | ICD-10-CM

## 2016-09-30 DIAGNOSIS — E538 Deficiency of other specified B group vitamins: Secondary | ICD-10-CM | POA: Diagnosis not present

## 2016-09-30 DIAGNOSIS — K219 Gastro-esophageal reflux disease without esophagitis: Secondary | ICD-10-CM

## 2016-09-30 LAB — CBC WITH DIFFERENTIAL/PLATELET
Basophils Absolute: 0.1 10*3/uL (ref 0.0–0.1)
Basophils Relative: 0.8 % (ref 0.0–3.0)
EOS PCT: 11 % — AB (ref 0.0–5.0)
Eosinophils Absolute: 0.9 10*3/uL — ABNORMAL HIGH (ref 0.0–0.7)
HEMATOCRIT: 42.1 % (ref 36.0–46.0)
HEMOGLOBIN: 13.9 g/dL (ref 12.0–15.0)
LYMPHS PCT: 30.2 % (ref 12.0–46.0)
Lymphs Abs: 2.4 10*3/uL (ref 0.7–4.0)
MCHC: 33 g/dL (ref 30.0–36.0)
MCV: 97.3 fl (ref 78.0–100.0)
MONOS PCT: 7.2 % (ref 3.0–12.0)
Monocytes Absolute: 0.6 10*3/uL (ref 0.1–1.0)
Neutro Abs: 4 10*3/uL (ref 1.4–7.7)
Neutrophils Relative %: 50.8 % (ref 43.0–77.0)
Platelets: 350 10*3/uL (ref 150.0–400.0)
RBC: 4.33 Mil/uL (ref 3.87–5.11)
RDW: 13 % (ref 11.5–15.5)
WBC: 7.8 10*3/uL (ref 4.0–10.5)

## 2016-09-30 LAB — COMPREHENSIVE METABOLIC PANEL
ALBUMIN: 4.7 g/dL (ref 3.5–5.2)
ALK PHOS: 50 U/L (ref 39–117)
ALT: 16 U/L (ref 0–35)
AST: 25 U/L (ref 0–37)
BILIRUBIN TOTAL: 0.5 mg/dL (ref 0.2–1.2)
BUN: 15 mg/dL (ref 6–23)
CO2: 29 mEq/L (ref 19–32)
Calcium: 9.9 mg/dL (ref 8.4–10.5)
Chloride: 103 mEq/L (ref 96–112)
Creatinine, Ser: 0.9 mg/dL (ref 0.40–1.20)
GFR: 73.09 mL/min (ref >60.00)
Glucose, Bld: 84 mg/dL (ref 70–99)
POTASSIUM: 4.6 meq/L (ref 3.5–5.1)
Sodium: 139 mEq/L (ref 135–145)
TOTAL PROTEIN: 8 g/dL (ref 6.0–8.3)

## 2016-09-30 LAB — HIGH SENSITIVITY CRP: CRP HIGH SENSITIVITY: 0.34 mg/L (ref 0.000–5.000)

## 2016-09-30 LAB — VITAMIN B12: Vitamin B-12: 803 pg/mL (ref 211–911)

## 2016-09-30 MED ORDER — MESALAMINE 1.2 G PO TBEC: 2.4000 g | DELAYED_RELEASE_TABLET | Freq: Every day | ORAL | 1 refills | Status: DC

## 2016-09-30 MED ORDER — NA SULFATE-K SULFATE-MG SULF 17.5-3.13-1.6 GM/177ML PO SOLN: ORAL | 0 refills | Status: DC

## 2016-09-30 NOTE — Progress Notes (Signed)
Subjective:    Patient ID: Anna Berger, female    DOB: 08-06-1975, 41 y.o.   MRN: [id number]  HPI Anna Berger is a 41 year old female with a history of long-standing panulcerative colitis, GERD, vitamin D and B12 deficiency, factor V Leiden deficiency who is here for follow-up. She was last seen in March 2017.She is here today with her 21-year-old son.  Her last surveillance colonoscopy was performed in July 2016. This revealed a sessile cecal polyp removed with cold snare. Or changes of chronic colitis without evidence of active colitis throughout the colon. The cecal polyp was benign polypoid mucosa. The surveillance biopsies were negative for dysplasia. There was mild active proctitis in the rectum.  She reports that she has had a very good year. She continues Lialda 2.4 g daily. If she misses a day of taking this medication she feels bloating abdominal discomfort but not rectal bleeding. She has not needed Canasa suppositories and denies rectal bleeding and proctitis type symptoms including tenesmus. No abdominal pain today. Good appetite. She does have heartburn which is well controlled with Nexium. She states she tried Zantac in place of Nexium but had uncontrolled reflux. Currently reflux well-controlled on Nexium 20 mg daily. No dysphagia or odynophagia. No nausea or vomiting.  She reports her sister was diagnosed with ulcerative colitis in December. She is managed in [place]. Daily history notable for 2 maternal uncles 1 with Crohn's disease the other with colon cancer (not assoc with IBD)  She does take her B12 monthly which she states helped tremendously. She does feel fatigue and decreased energy without this medication.  Review of Systems As per history of present illness, otherwise negative  Current Medications, Allergies, Past Medical History, Past Surgical History, Family History and Social History were reviewed in [employer] record.     Objective:   Physical Exam BP 100/70   Ht 5' 3"  (1.6 m)   Wt 120 lb (54.4 kg)   LMP 09/16/2016 (Approximate)   BMI 21.26 kg/m  Constitutional: Well-developed and well-nourished. No distress. HEENT: Normocephalic and atraumatic.  Conjunctivae are normal.  No scleral icterus. Neck: Neck supple. Trachea midline. Cardiovascular: Normal rate, regular rhythm and intact distal pulses. No M/R/G Pulmonary/chest: Effort normal and breath sounds normal. No wheezing, rales or rhonchi. Abdominal: Soft, nontender, nondistended. Bowel sounds active throughout. There are no masses palpable. No hepatosplenomegaly. Extremities: no clubbing, cyanosis, or edema Neurological: Alert and oriented to person place and time. Skin: Skin is warm and dry. Psychiatric: Normal mood and affect. Behavior is normal.     Assessment & Plan:  41 year old female with a history of long-standing panulcerative colitis, GERD, vitamin D and B12 deficiency, factor V Leiden deficiency who is here for follow-up.  1. Long-standing panulcerative colitis -- currently well controlled and a clinical remission on Lialda 2.4 g daily. She has not needed Canasa recently for proctitis symptoms. We will continue Lialda 2.4 g daily which has worked very well for her. She is due surveillance colonoscopy in August of this year. We discussed the risk, benefits and alternatives and she wishes to proceed. --Check CBC, CMP and CRP today.  2. History of B12 deficiency -- continue monthly IM B12. Check B12 levels today  3. History of vitamin D deficiency -- check vitamin D today.  4. GERD -- control with Nexium at relatively low dose. Trial of H2 blocker unsuccessful. We'll continue Nexium 20 mg once daily for GERD without alarm symptoms.  Annual office follow-up, sooner if needed Colonoscopy  to be scheduled

## 2016-09-30 NOTE — Patient Instructions (Addendum)
You have been scheduled for a colonoscopy. Please follow written instructions given to you at your visit today.  Please pick up your prep supplies at the pharmacy within the next 1-3 days. If you use inhalers (even only as needed), please bring them with you on the day of your procedure. Your physician has requested that you go to www.startemmi.com and enter the access code given to you at your visit today. This web site gives a general overview about your procedure. However, you should still follow specific instructions given to you by our office regarding your preparation for the procedure.  Your physician has requested that you go to the basement for the following lab work before leaving today: CBC, CMP, CRP, B12, Vitamin D  We have sent the following medications to your pharmacy for you to pick up at your convenience: Lialda 2.4 grams daily  If you are age 41 or older, your body mass index should be between 23-30. Your Body mass index is 21.26 kg/m. If this is out of the aforementioned range listed, please consider follow up with your Primary Care Provider.  If you are age 70 or younger, your body mass index should be between 19-25. Your Body mass index is 21.26 kg/m. If this is out of the aformentioned range listed, please consider follow up with your Primary Care Provider.

## 2016-10-05 LAB — VITAMIN D 1,25 DIHYDROXY
Vitamin D 1, 25 (OH)2 Total: 75 pg/mL — ABNORMAL HIGH (ref 18–72)
Vitamin D2 1, 25 (OH)2: <8 pg/mL
Vitamin D3 1, 25 (OH)2: 75 pg/mL

## 2016-10-12 ENCOUNTER — Other Ambulatory Visit: Payer: Self-pay | Admitting: Internal Medicine

## 2016-12-26 ENCOUNTER — Encounter: Payer: BLUE CROSS/BLUE SHIELD | Admitting: Internal Medicine

## 2016-12-26 ENCOUNTER — Ambulatory Visit (AMBULATORY_SURGERY_CENTER): Payer: BLUE CROSS/BLUE SHIELD | Admitting: Internal Medicine

## 2016-12-26 ENCOUNTER — Encounter: Payer: Self-pay | Admitting: Internal Medicine

## 2016-12-26 VITALS — BP 126/58 | HR 60 | Temp 98.2°F | Resp 17 | Ht 63.0 in | Wt 120.0 lb

## 2016-12-26 DIAGNOSIS — D120 Benign neoplasm of cecum: Secondary | ICD-10-CM

## 2016-12-26 DIAGNOSIS — K5100 Ulcerative (chronic) pancolitis without complications: Secondary | ICD-10-CM | POA: Diagnosis not present

## 2016-12-26 DIAGNOSIS — K635 Polyp of colon: Secondary | ICD-10-CM | POA: Diagnosis not present

## 2016-12-26 DIAGNOSIS — K529 Noninfective gastroenteritis and colitis, unspecified: Secondary | ICD-10-CM | POA: Diagnosis not present

## 2016-12-26 MED ORDER — MESALAMINE 1.2 G PO TBEC: 4.8000 g | DELAYED_RELEASE_TABLET | Freq: Every day | ORAL | 1 refills | Status: DC

## 2016-12-26 MED ORDER — SODIUM CHLORIDE 0.9 % IV SOLN: 500.0000 mL | INTRAVENOUS | Status: AC

## 2016-12-26 NOTE — Progress Notes (Signed)
Called to room to assist during endoscopic procedure.  Patient ID and intended procedure confirmed with present staff. Received instructions for my participation in the procedure from the performing physician.  

## 2016-12-26 NOTE — Op Note (Addendum)
[facility] Patient Name: Anna Berger Procedure Date: 12/26/2016 8:02 AM MRN: [id number] Endoscopist: Jerene Bears , MD Age: 41 Referring MD:  Date of Birth: 09-03-1975 Gender: Female Account #: [id number] Procedure:                Colonoscopy Indications:              High risk colon cancer surveillance: Ulcerative                            pancolitis of 8 (or more) years duration, Last                            colonoscopy: July 2016 showing normal right colon                            biopsies, quiescent colitis in the left colon and                            chronic active proctitis all without dysplasia Medicines:                Monitored Anesthesia Care Procedure:                Pre-Anesthesia Assessment:                           - Prior to the procedure, a History and Physical                            was performed, and patient medications and                            allergies were reviewed. The patient's tolerance of                            previous anesthesia was also reviewed. The risks                            and benefits of the procedure and the sedation                            options and risks were discussed with the patient.                            All questions were answered, and informed consent                            was obtained. Prior Anticoagulants: The patient has                            taken no previous anticoagulant or antiplatelet                            agents. ASA Grade Assessment: II - A patient with  mild systemic disease. After reviewing the risks                            and benefits, the patient was deemed in                            satisfactory condition to undergo the procedure.                           After obtaining informed consent, the colonoscope                            was passed under direct vision. Throughout the                            procedure, the patient's  blood pressure, pulse, and                            oxygen saturations were monitored continuously. The                            Colonoscope was introduced through the anus and                            advanced to the the terminal ileum. The colonoscopy                            was performed without difficulty. The patient                            tolerated the procedure well. The quality of the                            bowel preparation was good. The terminal ileum,                            ileocecal valve, appendiceal orifice, and rectum                            were photographed. Scope In: 8:11:24 AM Scope Out: 8:28:17 AM Scope Withdrawal Time: 0 hours 14 minutes 46 seconds  Total Procedure Duration: 0 hours 16 minutes 53 seconds  Findings:                 The digital rectal exam was normal.                           The terminal ileum appeared normal.                           A 4 mm polyp was found in the ileocecal valve. The                            polyp was sessile. The polyp was removed with a  cold snare. Resection and retrieval were complete.                            (Jar 1)                           Inflammation characterized by erythema and                            granularity was found as small patches surrounded                            by normal mucosa in the ascending colon. The cecum                            was spared. This was mild in severity.                           Inflammation characterized by shallow ulcerations                            (2) was found as small patches surrounded by normal                            mucosa in the sigmoid colon (1) and in the                            descending colon (2). When compared to previous                            examinations, the findings are new. Biopsies were                            taken from these ulcers with a cold forceps for                             histology (Jar 3).                           Inflammation characterized by altered vascularity                            and scarring was found in a continuous and                            circumferential pattern from the rectum to the                            hepatic flexure. When compared to previous                            examinations, the findings are unchanged. Four  biopsies were taken every 10 cm with a cold forceps                            from the entire colon for ulcerative colitis                            surveillance (Jar 2 right colon, Jar 4 left colon).                            These biopsy specimens from the right colon and                            left colon were sent to Pathology.                           Retroflexion in the rectum was not performed due to                            anatomy. Complications:            No immediate complications. Estimated Blood Loss:     Estimated blood loss was minimal. Estimated blood                            loss was minimal. Impression:               - The examined portion of the ileum was normal.                           - One 4 mm polyp at the ileocecal valve, removed                            with a cold snare. Resected and retrieved.                           - Patchy colitis was found in the ascending colon.                            This was mild in severity.                           - 2 ulcerations in the descending and sigmoid                            colon. The findings are new compared to previous                            examinations. Biopsied.                           - Ulcerative colitis, changes of chronic                            inflammation without active inflammation (with  exception of 2 left colonic ulcers) was found from                            the rectum to the hepatic flexure. The findings are                             unchanged compared to previous examinations.                            Surveillance biopsies throughout. Recommendation:           - Patient has a contact number available for                            emergencies. The signs and symptoms of potential                            delayed complications were discussed with the                            patient. Return to normal activities tomorrow.                            Written discharge instructions were provided to the                            patient.                           - Resume previous diet.                           - Continue present medications. Increase Lialda to                            4.8 g daily x 1 month and then call for symptom                            update (if minor crampy discomfort and loose stools                            are gone can decrease back to 2.4 g daily).                           - Await pathology results.                           - Repeat colonoscopy for surveillance based on                            pathology results. Jerene Bears, MD 12/26/2016 8:41:04 AM This report has been signed electronically.

## 2016-12-26 NOTE — Progress Notes (Signed)
Report given to PACU, vss 

## 2016-12-26 NOTE — Patient Instructions (Addendum)
Impression/Recommendations:  Polyp handout given to patient. Ulcerative colitis handout given to patient.  Resume previous diet. Increase Lialda (Mesalamine) to 4 tablets daily for one month, than reports back to Dr. Vena Rua office how you are doing.  Repeat colonoscopy for surveillance.   Date to be determined based on pathology results.  YOU HAD AN ENDOSCOPIC PROCEDURE TODAY AT [facility]:   Refer to the procedure report that was given to you for any specific questions about what was found during the examination.  If the procedure report does not answer your questions, please call your gastroenterologist to clarify.  If you requested that your care partner not be given the details of your procedure findings, then the procedure report has been included in a sealed envelope for you to review at your convenience later.  YOU SHOULD EXPECT: Some feelings of bloating in the abdomen. Passage of more gas than usual.  Walking can help get rid of the air that was put into your GI tract during the procedure and reduce the bloating. If you had a lower endoscopy (such as a colonoscopy or flexible sigmoidoscopy) you may notice spotting of blood in your stool or on the toilet paper. If you underwent a bowel prep for your procedure, you may not have a normal bowel movement for a few days.  Please Note:  You might notice some irritation and congestion in your nose or some drainage.  This is from the oxygen used during your procedure.  There is no need for concern and it should clear up in a day or so.  SYMPTOMS TO REPORT IMMEDIATELY:   Following lower endoscopy (colonoscopy or flexible sigmoidoscopy):  Excessive amounts of blood in the stool  Significant tenderness or worsening of abdominal pains  Swelling of the abdomen that is new, acute  Fever of 100F or higher  Vomiting of blood or coffee ground material   For urgent or emergent issues, a gastroenterologist can be reached at any  hour by calling [phone].   DIET:  We do recommend a small meal at first, but then you may proceed to your regular diet.  Drink plenty of fluids but you should avoid alcoholic beverages for 24 hours.  ACTIVITY:  You should plan to take it easy for the rest of today and you should NOT DRIVE or use heavy machinery until tomorrow (because of the sedation medicines used during the test).    FOLLOW UP: Our staff will call the number listed on your records the next business day following your procedure to check on you and address any questions or concerns that you may have regarding the information given to you following your procedure. If we do not reach you, we will leave a message.  However, if you are feeling well and you are not experiencing any problems, there is no need to return our call.  We will assume that you have returned to your regular daily activities without incident.  If any biopsies were taken you will be contacted by phone or by letter within the next 1-3 weeks.  Please call us at [phone] if you have not heard about the biopsies in 3 weeks.    SIGNATURES/CONFIDENTIALITY: You and/or your care partner have signed paperwork which will be entered into your electronic medical record.  These signatures attest to the fact that that the information above on your After Visit Summary has been reviewed and is understood.  Full responsibility of the confidentiality of this discharge information  lies with you and/or your care-partner.

## 2016-12-26 NOTE — Progress Notes (Signed)
Pt's states no medical or surgical changes since previsit or office visit. 

## 2016-12-27 ENCOUNTER — Telehealth: Payer: Self-pay

## 2016-12-27 NOTE — Telephone Encounter (Signed)
  Follow up Call-  Call back number 12/26/2016 12/29/2014  Post procedure Call Back phone  # [phone]  Permission to leave phone message Yes Yes  Some recent data might be hidden     Left message

## 2016-12-29 ENCOUNTER — Encounter: Payer: Self-pay | Admitting: Internal Medicine

## 2017-01-22 ENCOUNTER — Other Ambulatory Visit: Payer: Self-pay | Admitting: Internal Medicine

## 2017-01-25 ENCOUNTER — Other Ambulatory Visit: Payer: Self-pay | Admitting: Internal Medicine

## 2017-01-31 ENCOUNTER — Telehealth: Payer: Self-pay | Admitting: Internal Medicine

## 2017-01-31 DIAGNOSIS — K5100 Ulcerative (chronic) pancolitis without complications: Secondary | ICD-10-CM

## 2017-01-31 MED ORDER — MESALAMINE 1.2 G PO TBEC: 4.8000 g | DELAYED_RELEASE_TABLET | Freq: Every day | ORAL | 0 refills | Status: DC

## 2017-01-31 NOTE — Telephone Encounter (Signed)
Dr Hilarie Fredrickson- Patient doing well on Lialda 4.8 g daily and wants to continue this dosage. However, your procedure note indicates she can drop back down to 2.4 g daily if doing better after 1 month of 4.8 g. Please advise.Marland KitchenMarland KitchenMarland Kitchen

## 2017-01-31 NOTE — Telephone Encounter (Signed)
Rx sent 

## 2017-01-31 NOTE — Telephone Encounter (Signed)
I am okay with the 4.8g daily dosing for her.  She is well versed with her IBD and symptoms and if she does better with the higher dose, then fine with me

## 2017-02-01 ENCOUNTER — Other Ambulatory Visit: Payer: Self-pay | Admitting: *Deleted

## 2017-02-01 MED ORDER — CYANOCOBALAMIN 1000 MCG/ML IJ SOLN: 1000.0000 ug | INTRAMUSCULAR | 0 refills | Status: DC

## 2017-04-13 ENCOUNTER — Telehealth: Payer: Self-pay

## 2017-04-13 NOTE — Telephone Encounter (Signed)
Rx was sent on 01/31/17 to [facility] for 4 tabs daily (#360) so I am unsure why she was only given enough for 2 tabs daily.   At any rate, her insurance has now denied Lialda ([place]- Reference [id number]). They want her to have first tried and failed TWO formulary medications including Apriso, Dipentum 250 mg, sulfasalzine 500 mg first. Per our records, I can see that patient has tried sulfasalazine before but do not see documentation for another mesalamine product. Should we try for Apriso?  Dr Hilarie Fredrickson, please advise.Marland KitchenMarland KitchenMarland Kitchen

## 2017-04-13 NOTE — Telephone Encounter (Signed)
Waiting on patient call back.

## 2017-04-13 NOTE — Telephone Encounter (Signed)
She is doing so well on current therapy, will prefer not to change However if no options with her insurance can change to Apriso 1.5 g daily --let her know this is the same medication that she is currently on

## 2017-04-14 MED ORDER — MESALAMINE ER 0.375 G PO CP24: 1500.0000 mg | ORAL_CAPSULE | Freq: Every day | ORAL | 2 refills | Status: DC

## 2017-04-14 NOTE — Telephone Encounter (Signed)
Apriso went through at pharmacy for $75.00 for 1 month supply. Patient advised.

## 2017-06-13 ENCOUNTER — Telehealth: Payer: Self-pay | Admitting: Internal Medicine

## 2017-06-13 MED ORDER — BUDESONIDE ER 9 MG PO TB24: 1.0000 | ORAL_TABLET | Freq: Every day | ORAL | 1 refills | Status: DC

## 2017-06-13 NOTE — Telephone Encounter (Signed)
Patient reports that she has a history of [place].  She was maintained on Lialda until Nov when her insurance refused to pay and she was switched to [employer].  She is not doing well on Apriso having frequent semi formed stools with occasions blood in the stool.  She will switch insurance carriers on 06/26/17 to [facility]  Dr. Hilarie Fredrickson can we switch her back to Lialda on 06/26/17 (previously on 4.8 G)?  Does sheh need any alternative tx until this time?   Baldo Daub

## 2017-06-13 NOTE — Telephone Encounter (Signed)
I have spoken to patient to advise of Dr Vena Rua recommendations to add Uceris 9 mg x 6 weeks to her current med (Apriso) until we can get her started back on Lialda. Patient will contact us closer to 06/26/17 with her new insurance information so we can send a new prescription to the pharmacy for [place] and can get prior approval. Patient verbalizes understanding of plan. Uceris sent to pharmacy.

## 2017-06-13 NOTE — Telephone Encounter (Signed)
Let us definitely switch back to Lialda 4.8 g daily Can offer Uceris 9 mg x 6 weeks to help control colitis until she gets back on Lialda If symptoms worsen before that please have her let us know

## 2017-06-15 ENCOUNTER — Telehealth: Payer: Self-pay | Admitting: Internal Medicine

## 2017-06-15 MED ORDER — PREDNISONE 5 MG PO TABS: ORAL_TABLET | ORAL | 0 refills | Status: DC

## 2017-06-15 NOTE — Telephone Encounter (Signed)
Dr Hilarie Fredrickson, Gaetano Hawthorne is not on patient formulary at all. That means I am not even able to attempt prior authorization. What is my next option until 1/21?

## 2017-06-15 NOTE — Telephone Encounter (Signed)
Dottie are you waiting on a call from this pt?

## 2017-06-15 NOTE — Telephone Encounter (Signed)
See if she would be willing to have low dose prednisone, and if so then: Prednisone 20 mg x 1 week and down by 5 mg every 7 days until off Restart Lialda ASAP at 4.8 g daily

## 2017-06-15 NOTE — Telephone Encounter (Signed)
Patient is agreeable to Prednisone taper for now and will restart Lialda as soon as new insurance takes effect. New prednisone rx has been sent to pharmacy and uceris rx discontinued.

## 2017-06-16 ENCOUNTER — Other Ambulatory Visit: Payer: Self-pay | Admitting: Internal Medicine

## 2017-07-04 ENCOUNTER — Telehealth: Payer: Self-pay | Admitting: Internal Medicine

## 2017-07-04 MED ORDER — MESALAMINE 1.2 G PO TBEC: 4.8000 g | DELAYED_RELEASE_TABLET | Freq: Every day | ORAL | 1 refills | Status: DC

## 2017-07-04 NOTE — Telephone Encounter (Signed)
Lialda prescription sent to patient's pharmacy.

## 2017-07-12 ENCOUNTER — Other Ambulatory Visit: Payer: Self-pay | Admitting: Internal Medicine

## 2017-09-05 ENCOUNTER — Other Ambulatory Visit: Payer: Self-pay | Admitting: Internal Medicine

## 2017-10-31 ENCOUNTER — Other Ambulatory Visit: Payer: Self-pay | Admitting: Internal Medicine

## 2017-11-03 ENCOUNTER — Telehealth: Payer: Self-pay | Admitting: Internal Medicine

## 2017-11-03 ENCOUNTER — Other Ambulatory Visit: Payer: Self-pay | Admitting: Internal Medicine

## 2017-11-03 MED ORDER — CYANOCOBALAMIN 1000 MCG/ML IJ SOLN: 1000.0000 ug | INTRAMUSCULAR | 0 refills | Status: DC

## 2017-11-03 NOTE — Telephone Encounter (Signed)
Rx sent 

## 2017-11-11 ENCOUNTER — Other Ambulatory Visit: Payer: Self-pay | Admitting: Internal Medicine

## 2017-11-29 ENCOUNTER — Other Ambulatory Visit: Payer: Self-pay | Admitting: Obstetrics and Gynecology

## 2017-11-29 DIAGNOSIS — N8000 Endometriosis of the uterus, unspecified: Secondary | ICD-10-CM | POA: Insufficient documentation

## 2017-12-31 ENCOUNTER — Other Ambulatory Visit: Payer: Self-pay | Admitting: Internal Medicine

## 2018-01-13 ENCOUNTER — Other Ambulatory Visit: Payer: Self-pay | Admitting: Internal Medicine

## 2018-01-23 ENCOUNTER — Ambulatory Visit: Payer: BLUE CROSS/BLUE SHIELD | Admitting: Internal Medicine

## 2018-01-23 ENCOUNTER — Encounter: Payer: Self-pay | Admitting: Internal Medicine

## 2018-01-23 VITALS — BP 100/72 | HR 72 | Ht 63.0 in | Wt 134.0 lb

## 2018-01-23 DIAGNOSIS — K219 Gastro-esophageal reflux disease without esophagitis: Secondary | ICD-10-CM | POA: Diagnosis not present

## 2018-01-23 DIAGNOSIS — K5100 Ulcerative (chronic) pancolitis without complications: Secondary | ICD-10-CM

## 2018-01-23 DIAGNOSIS — E538 Deficiency of other specified B group vitamins: Secondary | ICD-10-CM | POA: Diagnosis not present

## 2018-01-23 MED ORDER — CYANOCOBALAMIN 1000 MCG/ML IJ SOLN: INTRAMUSCULAR | 0 refills | Status: DC

## 2018-01-23 MED ORDER — MESALAMINE 1.2 G PO TBEC: DELAYED_RELEASE_TABLET | ORAL | 2 refills | Status: DC

## 2018-01-23 MED ORDER — BUDESONIDE ER 9 MG PO TB24: 1.0000 | ORAL_TABLET | Freq: Every day | ORAL | 1 refills | Status: DC

## 2018-01-23 NOTE — Progress Notes (Signed)
Subjective:    Patient ID: Anna Berger, female    DOB: Oct 25, 1975, 42 y.o.   MRN: [id number]  HPI Anna Berger is a 42 year old female with long-standing pan ulcerative colitis, GERD, vitamin B12 and vitamin D deficiency, factor V Leiden deficiency who is here for follow-up.  She was last seen at the time of her colonoscopy 1 year ago in clinic on 09/30/2016.  She is here alone today.  Her last colonoscopy revealed a 4 mm polyp at the ileocecal valve.  There was patchy inflammation in the ascending colon which was mild.  There were also shallow ulcerations in the sigmoid and descending colon.  There was altered vascularity and scarring from rectum to hepatic flexure.  Surveillance biopsies were performed.  Pathology showed hyperplastic polyp at ileocecal valve.  Chronic quiescent colitis in the right colon.  Minimally active chronic colitis without dysplasia in the descending colon ulcerations.  There is also minimally active chronic colitis in the sigmoid and rectum.  No dysplasia.  After her colonoscopy we increased her Lialda from 2.4 to 4.8 g daily.  This did seem to improve symptoms but then we had to switch her to Apriso because of insurance coverage.  This definitively was not as good for her.  We switch back to Lialda in January and she has been taking 4.8 g daily.  Symptoms are not as good as she would like and she reports she will have some good days and some not so good days.  Stools overall are more frequent with more abdominal cramping bloating mucus and gas.  She is not having blood.  She has some mild nausea but no tenesmus.  Appetite has remained strong and she has gained about 14 pounds.  She is using Nexium 20 mg daily which works well to control her GERD and dyspeptic symptoms.  No dysphagia or odynophagia.  She is taking B12 monthly and vitamin D levels have normalized.   Review of Systems As per HPI, otherwise negative  Current Medications, Allergies, Past Medical History, Past  Surgical History, Family History and Social History were reviewed in [employer] record.     Objective:   Physical Exam BP 100/72   Pulse 72   Ht 5' 3"  (1.6 m)   Wt 134 lb (60.8 kg)   BMI 23.74 kg/m  Constitutional: Well-developed and well-nourished. No distress. HEENT: Normocephalic and atraumatic. Oropharynx is clear and moist. Conjunctivae are normal.  No scleral icterus. Neck: Neck supple. Trachea midline. Cardiovascular: Normal rate, regular rhythm and intact distal pulses. No M/R/G Pulmonary/chest: Effort normal and breath sounds normal. No wheezing, rales or rhonchi. Abdominal: Soft, abdominal tenderness without rebound or guarding, nondistended. Bowel sounds active throughout. There are no masses palpable. No hepatosplenomegaly. Extremities: no clubbing, cyanosis, or edema Neurological: Alert and oriented to person place and time. Skin: Skin is warm and dry. Psychiatric: Normal mood and affect. Behavior is normal.      Assessment & Plan:  42 year old female with long-standing pan ulcerative colitis, GERD, vitamin B12 and vitamin D deficiency, factor V Leiden deficiency who is here for follow-up.   1.  Long-standing pan ulcerative colitis --terms of active inflammation now despite Lialda 4.8 g daily.  We discussed escalation of care including other medical options with biologic therapy such as Humira or Entyvio.  I am going to give her Uceris 9 mg daily x8 weeks to see if we can recapture remission.  Continue Lialda 4.8 g daily.  I would like her  to come back in October or November for continuity.  If no better with Uceris after 3 to 4 weeks she should notify me.  In this event we may need to escalate therapy sooner than at follow-up.  She was given brochure information for Entyvio to review.  She would prefer Entyvio over Humira she believes. --He is due surveillance colonoscopy next summer in July 2020  2.  B12 deficiency --continue monthly IM B12  3.   GERD --well-controlled with Nexium.  She will continue 20 mg daily  25 minutes spent with the patient today. Greater than 50% was spent in counseling and coordination of care with the patient

## 2018-01-23 NOTE — Patient Instructions (Addendum)
We have sent the following medications to your pharmacy for you to pick up at your convenience: Lialda 4.8 grams daily Uceris 9 mg daily x 8 weeks B12 once monthly  Please follow up with Dr Hilarie Fredrickson on 04/06/18 at 9:45 am.   If you are age 43 or older, your body mass index should be between 23-30. Your Body mass index is 23.74 kg/m. If this is out of the aforementioned range listed, please consider follow up with your Primary Care Provider.  If you are age 94 or younger, your body mass index should be between 19-25. Your Body mass index is 23.74 kg/m. If this is out of the aformentioned range listed, please consider follow up with your Primary Care Provider.

## 2018-02-16 ENCOUNTER — Other Ambulatory Visit: Payer: Self-pay | Admitting: Internal Medicine

## 2018-03-26 ENCOUNTER — Other Ambulatory Visit: Payer: Self-pay | Admitting: Internal Medicine

## 2018-04-06 ENCOUNTER — Ambulatory Visit: Payer: 59 | Admitting: Internal Medicine

## 2018-04-17 ENCOUNTER — Encounter: Payer: Self-pay | Admitting: Internal Medicine

## 2018-04-17 ENCOUNTER — Ambulatory Visit: Payer: 59 | Admitting: Internal Medicine

## 2018-04-17 ENCOUNTER — Other Ambulatory Visit (INDEPENDENT_AMBULATORY_CARE_PROVIDER_SITE_OTHER): Payer: 59

## 2018-04-17 VITALS — BP 114/70 | HR 80 | Ht 63.0 in | Wt 132.1 lb

## 2018-04-17 DIAGNOSIS — K5100 Ulcerative (chronic) pancolitis without complications: Secondary | ICD-10-CM

## 2018-04-17 DIAGNOSIS — Z79899 Other long term (current) drug therapy: Secondary | ICD-10-CM

## 2018-04-17 DIAGNOSIS — Z23 Encounter for immunization: Secondary | ICD-10-CM

## 2018-04-17 LAB — CBC WITH DIFFERENTIAL/PLATELET
BASOS PCT: 1.4 % (ref 0.0–3.0)
Basophils Absolute: 0.1 10*3/uL (ref 0.0–0.1)
EOS PCT: 4.1 % (ref 0.0–5.0)
Eosinophils Absolute: 0.3 10*3/uL (ref 0.0–0.7)
HEMATOCRIT: 41.2 % (ref 36.0–46.0)
Hemoglobin: 13.7 g/dL (ref 12.0–15.0)
Lymphocytes Relative: 22.2 % (ref 12.0–46.0)
Lymphs Abs: 1.7 10*3/uL (ref 0.7–4.0)
MCHC: 33.2 g/dL (ref 30.0–36.0)
MCV: 96.5 fl (ref 78.0–100.0)
MONO ABS: 0.5 10*3/uL (ref 0.1–1.0)
MONOS PCT: 6.8 % (ref 3.0–12.0)
Neutro Abs: 4.9 10*3/uL (ref 1.4–7.7)
Neutrophils Relative %: 65.5 % (ref 43.0–77.0)
Platelets: 306 10*3/uL (ref 150.0–400.0)
RBC: 4.28 Mil/uL (ref 3.87–5.11)
RDW: 14.1 % (ref 11.5–15.5)
WBC: 7.5 10*3/uL (ref 4.0–10.5)

## 2018-04-17 LAB — HIGH SENSITIVITY CRP: CRP, High Sensitivity: 1.36 mg/L (ref 0.000–5.000)

## 2018-04-17 LAB — COMPREHENSIVE METABOLIC PANEL
ALBUMIN: 4.4 g/dL (ref 3.5–5.2)
ALT: 16 U/L (ref 0–35)
AST: 24 U/L (ref 0–37)
Alkaline Phosphatase: 51 U/L (ref 39–117)
BUN: 20 mg/dL (ref 6–23)
CHLORIDE: 103 meq/L (ref 96–112)
CO2: 26 mEq/L (ref 19–32)
Calcium: 9.5 mg/dL (ref 8.4–10.5)
Creatinine, Ser: 0.68 mg/dL (ref 0.40–1.20)
GFR: 100.25 mL/min (ref >60.00)
Glucose, Bld: 91 mg/dL (ref 70–99)
POTASSIUM: 4.5 meq/L (ref 3.5–5.1)
SODIUM: 137 meq/L (ref 135–145)
Total Bilirubin: 0.5 mg/dL (ref 0.2–1.2)
Total Protein: 7.6 g/dL (ref 6.0–8.3)

## 2018-04-17 MED ORDER — ZOSTER VAC RECOMB ADJUVANTED 50 MCG/0.5ML IM SUSR: INTRAMUSCULAR | 1 refills | Status: DC

## 2018-04-17 MED ORDER — MESALAMINE 1.2 G PO TBEC: DELAYED_RELEASE_TABLET | ORAL | 3 refills | Status: DC

## 2018-04-17 NOTE — Progress Notes (Signed)
Subjective:    Patient ID: Anna Berger, female    DOB: Sep 10, 1975, 42 y.o.   MRN: [id number]  HPI Anna Berger is a 42 year old female with a past medical history of pan ulcerative colitis, GERD, B12 and vitamin D deficiency, factor V Leiden deficiency who is here for follow-up.  She is here today with her husband and was last seen on 01/23/2018.  When she was last seen she was having symptoms of active ulcerative colitis particularly with crampy abdominal pain, looser more frequent stools.  This was despite Lialda 4.8 g daily.  We gave her Uceris 9 mg which she took for 8 weeks.  She reports this definitively helped improve her overall symptoms.  Her crampy abdominal pain has resolved.  Her stools are more formed and much less frequent.  She still having 3-4 bowel movements per day but states they have formed now.  No bleeding.  She is feeling well with no fevers or chills.  No upper GI or hepatobiliary complaint today.  She has been off of the Uceris 2 to 3 weeks.  She is also taking an antibiotic, cefdinir, for sinusitis.  She works in Teacher, English as a foreign language.  She has had her flu shot this year.  She and her husband both have questions about escalation of therapy both immunomodulator and biologic.  She has thought about these medications and also researched it.  Her sister who has IBD is planning to start azathioprine.  Review of Systems As per HPI, otherwise negative  Current Medications, Allergies, Past Medical History, Past Surgical History, Family History and Social History were reviewed in [employer] record.     Objective:   Physical Exam BP 114/70 (BP Location: Left Arm, Patient Position: Sitting, Cuff Size: Normal)   Pulse 80   Ht 5' 3"  (1.6 m)   Wt 132 lb 2 oz (59.9 kg)   LMP 04/04/2018   BMI 23.40 kg/m  Constitutional: Well-developed and well-nourished. No distress. HEENT: Normocephalic and atraumatic.  Conjunctivae are normal.  No scleral icterus. Neck: Neck  supple. Trachea midline. Cardiovascular: Normal rate, regular rhythm and intact distal pulses. No M/R/G Pulmonary/chest: Effort normal and breath sounds normal. No wheezing, rales or rhonchi. Abdominal: Soft, mild bilateral lower abdominal tenderness without rebound or guarding, nondistended. Bowel sounds active throughout. There are no masses palpable. No hepatosplenomegaly. Extremities: no clubbing, cyanosis, or edema Neurological: Alert and oriented to person place and time. Skin: Skin is warm and dry. Psychiatric: Normal mood and affect. Behavior is normal.  CBC    Component Value Date/Time   WBC 7.5 04/17/2018 1010   RBC 4.28 04/17/2018 1010   HGB 13.7 04/17/2018 1010   HCT 41.2 04/17/2018 1010   PLT 306.0 04/17/2018 1010   MCV 96.5 04/17/2018 1010   MCH 32.6 02/18/2014 0601   MCHC 33.2 04/17/2018 1010   RDW 14.1 04/17/2018 1010   LYMPHSABS 1.7 04/17/2018 1010   MONOABS 0.5 04/17/2018 1010   EOSABS 0.3 04/17/2018 1010   BASOSABS 0.1 04/17/2018 1010   CMP     Component Value Date/Time   NA 137 04/17/2018 1010   K 4.5 04/17/2018 1010   CL 103 04/17/2018 1010   CO2 26 04/17/2018 1010   GLUCOSE 91 04/17/2018 1010   BUN 20 04/17/2018 1010   CREATININE 0.68 04/17/2018 1010   CALCIUM 9.5 04/17/2018 1010   PROT 7.6 04/17/2018 1010   ALBUMIN 4.4 04/17/2018 1010   AST 24 04/17/2018 1010   ALT 16 04/17/2018 1010  ALKPHOS 51 04/17/2018 1010   BILITOT 0.5 04/17/2018 1010   GFRNONAA >90 02/17/2014 1955   GFRAA >90 02/17/2014 1955        Assessment & Plan:  42 year old female with a past medical history of pan ulcerative colitis, GERD, B12 and vitamin D deficiency, factor V Leiden deficiency who is here for follow-up.   1.  Long-standing pan ulcerative colitis --recently treated for active inflammation with Uceris, seems to have responded very well.  We spent considerable time today discussing escalation and maintenance therapy including immunomodulators and biologics.  We  also discussed the risks in great detail including the risk of infection (including reactivation of latent TB and underlying viral hepatitis), hepatotoxicity, leukopenia, pancreatitis, nausea, skin rash, malignancy (specifically lymphoma), demyelinating disease, and even heart failure.  We also discussed that she seems to have reentered clinical remission with Uceris.  If she remains in clinical remission then we may not need to escalate therapy however if symptoms recur in a short order requiring consideration or need for steroids then I recommend we escalate therapy.  She would be a candidate for azathioprine, Humira, Entyvio.  We specifically discussed all 3 medications today including the risk, benefits and alternatives to each.  We also discussed if we were to use Humira that we may use low-dose azathioprine to prevent antibody formation.  She wishes to think about this and will let me know if symptoms recur before follow-up.  Otherwise follow-up in 4 to 5 months.  Continue Lialda 4.8 g daily  Labs today CBC, CMP, CRP, QuantiFERON gold, viral hepatitis serologies, TPMT  Shingrix recommended and prescribed today which she will get from local pharmacy. She is previously had Pneumovax and influenza vaccine

## 2018-04-17 NOTE — Patient Instructions (Addendum)
Your provider has requested that you go to the basement level for lab work before leaving today. Press "B" on the elevator. The lab is located at the first door on the left as you exit the elevator.  We have sent the following medications to your pharmacy for you to pick up at your convenience: Lialda 4.8 grams daily  Continue to remain off Uceris for now.   Please follow up with Dr Hilarie Fredrickson in 4-5 months.  Please get your Shingles vaccine. We have given you a prescription to take to your pharmacy.  If you are age 16 or older, your body mass index should be between 23-30. Your Body mass index is 23.4 kg/m. If this is out of the aforementioned range listed, please consider follow up with your Primary Care Provider.  If you are age 25 or younger, your body mass index should be between 19-25. Your Body mass index is 23.4 kg/m. If this is out of the aformentioned range listed, please consider follow up with your Primary Care Provider.

## 2018-04-22 LAB — QUANTIFERON-TB GOLD PLUS
Mitogen-NIL: 9.06 IU/mL
NIL: 0.06 IU/mL
QuantiFERON-TB Gold Plus: NEGATIVE

## 2018-04-22 LAB — THIOPURINE METHYLTRANSFERASE (TPMT), RBC: THIOPURINE METHYLTRANSFERASE, RBC: 14 nmol/h/mL

## 2018-04-27 ENCOUNTER — Telehealth: Payer: Self-pay | Admitting: Internal Medicine

## 2018-04-27 NOTE — Telephone Encounter (Signed)
Patient states they are now requiring a prior auth for her medication lialda to be refilled. [facility]. Last seen 11.12.19

## 2018-04-27 NOTE — Telephone Encounter (Signed)
Prior authorization started on [email or web address]

## 2018-04-30 NOTE — Telephone Encounter (Signed)
Received fax from [employer] that coverage for Lialda 1.2 gm has been approved through 04/28/19.

## 2018-05-07 ENCOUNTER — Telehealth: Payer: Self-pay | Admitting: Internal Medicine

## 2018-05-07 NOTE — Telephone Encounter (Signed)
Dr Hilarie Fredrickson, please advise... I have already done prior auth and have gotten medication approved. It is still $300.

## 2018-05-07 NOTE — Telephone Encounter (Signed)
Can try apriso 1.5 g daily  Or  Asacol HD 1.6 g TID

## 2018-05-07 NOTE — Telephone Encounter (Signed)
Pt said that copay for lialda is over $300 and she cannot afford it. She is requesting to switch back to apriso.

## 2018-05-08 MED ORDER — MESALAMINE ER 0.375 G PO CP24: 1.5000 g | ORAL_CAPSULE | Freq: Every day | ORAL | 2 refills | Status: DC

## 2018-05-08 NOTE — Telephone Encounter (Signed)
Apriso sent to patient's pharmacy. Per pharmacy, this went through for $30 dollars. Patient is advised and verbalizes understanding.

## 2018-07-20 ENCOUNTER — Other Ambulatory Visit: Payer: Self-pay | Admitting: Internal Medicine

## 2018-08-08 ENCOUNTER — Other Ambulatory Visit: Payer: Self-pay | Admitting: Internal Medicine

## 2018-10-11 ENCOUNTER — Encounter: Payer: Self-pay | Admitting: *Deleted

## 2018-10-15 ENCOUNTER — Other Ambulatory Visit: Payer: Self-pay

## 2018-10-15 ENCOUNTER — Ambulatory Visit (INDEPENDENT_AMBULATORY_CARE_PROVIDER_SITE_OTHER): Payer: 59 | Admitting: Internal Medicine

## 2018-10-15 ENCOUNTER — Encounter: Payer: Self-pay | Admitting: Internal Medicine

## 2018-10-15 VITALS — Ht 63.0 in | Wt 125.0 lb

## 2018-10-15 DIAGNOSIS — K5100 Ulcerative (chronic) pancolitis without complications: Secondary | ICD-10-CM | POA: Diagnosis not present

## 2018-10-15 DIAGNOSIS — K5900 Constipation, unspecified: Secondary | ICD-10-CM

## 2018-10-15 NOTE — Patient Instructions (Addendum)
Continue Apriso 1.5 g daily  You will be due for colonoscopy in July 2020. We will contact you once a July schedule has been made available to [place].  Please purchase the following medications over the counter and take as directed: MiraLAX 17 g daily; dose titrate for efficacy.  Senokot would be another option.  Continue Nexium 20 mg daily

## 2018-10-15 NOTE — Progress Notes (Signed)
Subjective:    Patient ID: Dianne Dun, female    DOB: [birth year], 43 y.o.   MRN: [id number]  This service was provided via telemedicine.  Doximity platform with A/V communication The patient was located at home The provider was located in provider's GI office. The patient did consent to this telephone visit and is aware of possible charges through their insurance for this visit.   The persons participating in this telemedicine service were the patient and I. Time spent on call: 11 minutes   HPI Gilberte Gorley is a 43 year old female with a history of longstanding pan ulcerative colitis, GERD, B12 and vitamin D deficiency, factor V Leiden deficiency who is here for follow-up.  Seen virtually in the setting of COVID-19 pandemic.  Last seen in the office on 04/17/2018.  Her last colonoscopy was performed on 12/26/2016 for surveillance.  This revealed normal terminal ileum.  4 mm polyp at the IC valve removed and found to be hyperplastic.  Inflammation and small patches in the ascending colon which was mild.  Inflammation with shallow ulceration and patches in the sigmoid and descending colon.  These findings were new. Scarring and altered vascularity continuous from the rectum to the hepatic flexure.  Surveillance biopsies performed.  Biopsies from the right colon showed chronic quiescent colitis.  Descending colon ulcers minimally active chronic colitis.  No dysplasia.  Left colon minimally active chronic colitis.  No dysplasia.  She reports that she has been doing well.  With social distancing and less physical time at work her life is been "quieter" and less stressful.  Her 44 year old in 6 grade is doing school from home which has its own level of stress.  She also has a 68-year-old preschooler.  Her mesalamine was changed to [employer] for insurance purposes about 5 months ago.  She is tolerating this.  She has become more constipated than anything.  Bowel movements are now much less frequent and  can be every 4 to 5 days.  A couple of times she is used Dulcolax because she developed an uncomfortable abdominal fullness which she attributed to constipation.  No rectal bleeding or melena.  None of the crampy abdominal pain previously associated with her colitis.  She wonders if the change in her bowel habits is related to changing Lialda to Apriso.  No upper respiratory or COVID symptoms.  She remains on Nexium 20 mg daily.   Review of Systems As per HPI, otherwise negative  Current Medications, Allergies, Past Medical History, Past Surgical History, Family History and Social History were reviewed in [employer] record.     Objective:   Physical Exam No physical exam, virtual visit  CBC    Component Value Date/Time   WBC 7.5 04/17/2018 1010   RBC 4.28 04/17/2018 1010   HGB 13.7 04/17/2018 1010   HCT 41.2 04/17/2018 1010   PLT 306.0 04/17/2018 1010   MCV 96.5 04/17/2018 1010   MCH 32.6 02/18/2014 0601   MCHC 33.2 04/17/2018 1010   RDW 14.1 04/17/2018 1010   LYMPHSABS 1.7 04/17/2018 1010   MONOABS 0.5 04/17/2018 1010   EOSABS 0.3 04/17/2018 1010   BASOSABS 0.1 04/17/2018 1010   CMP     Component Value Date/Time   NA 137 04/17/2018 1010   K 4.5 04/17/2018 1010   CL 103 04/17/2018 1010   CO2 26 04/17/2018 1010   GLUCOSE 91 04/17/2018 1010   BUN 20 04/17/2018 1010   CREATININE 0.68 04/17/2018 1010   CALCIUM  9.5 04/17/2018 1010   PROT 7.6 04/17/2018 1010   ALBUMIN 4.4 04/17/2018 1010   AST 24 04/17/2018 1010   ALT 16 04/17/2018 1010   ALKPHOS 51 04/17/2018 1010   BILITOT 0.5 04/17/2018 1010   GFRNONAA >90 02/17/2014 1955   GFRAA >90 02/17/2014 1955       Assessment & Plan:  43 year old female with a history of longstanding pan ulcerative colitis, GERD, B12 and vitamin D deficiency, factor V Leiden deficiency who is here for follow-up.   1.  Longstanding pan ulcerative colitis --clinical remission.  In fact much less stool frequency and now  mild constipation.  We discussed how this may be related from changing Lialda to [facility] but may also indicate better control of her chronic colitis. --Continue Apriso 1.5 g daily --Surveillance colonoscopy July 2020.  Discussed the risk, benefits and alternatives and she is agreeable and wishes to proceed --See #2  2.  Mild constipation --may be secondary to improving colitis and endoscopic/histologic remission. --Begin MiraLAX 17 g daily; dose titrate for efficacy.  Senokot would be another option.  3.  GERD --well-controlled on Nexium.  Continue 20 mg daily.  No alarm symptoms.

## 2018-11-05 ENCOUNTER — Other Ambulatory Visit: Payer: Self-pay | Admitting: Internal Medicine

## 2018-11-06 ENCOUNTER — Telehealth: Payer: Self-pay | Admitting: *Deleted

## 2018-11-06 NOTE — Telephone Encounter (Signed)
-----   Message from Larina Bras, [place] sent at 10/15/2018  5:57 PM EDT ----- Pt needs July 2020 colon for ibd surveillence. See 10/15/18 televisit

## 2018-11-06 NOTE — Telephone Encounter (Signed)
Pt has been scheduled for previsit on 12/06/18 at 8 am and for colonoscopy at 730 am on 12/24/18. She verbalizes understanding.

## 2018-11-19 ENCOUNTER — Encounter: Payer: Self-pay | Admitting: Internal Medicine

## 2018-12-06 ENCOUNTER — Ambulatory Visit (AMBULATORY_SURGERY_CENTER): Payer: Self-pay

## 2018-12-06 ENCOUNTER — Other Ambulatory Visit: Payer: Self-pay

## 2018-12-06 VITALS — Ht 63.0 in | Wt 125.0 lb

## 2018-12-06 DIAGNOSIS — K5100 Ulcerative (chronic) pancolitis without complications: Secondary | ICD-10-CM

## 2018-12-06 MED ORDER — NA SULFATE-K SULFATE-MG SULF 17.5-3.13-1.6 GM/177ML PO SOLN: 1.0000 | Freq: Once | ORAL | 0 refills | Status: AC

## 2018-12-06 NOTE — Progress Notes (Signed)
Denies allergies to eggs or soy products. Denies complication of anesthesia or sedation. Denies use of weight loss medication. Denies use of O2.   Emmi instructions given for colonoscopy.   Pre-Visit was conducted by phone due to Covid 19. Instructions were reviewed and mailed to patients confirmed home address. A 15.00 coupon for Suprep was given to the patient. Patient was encouraged to call if she had questions regarding instructions.

## 2018-12-21 ENCOUNTER — Telehealth: Payer: Self-pay | Admitting: Internal Medicine

## 2018-12-21 NOTE — Telephone Encounter (Signed)

## 2018-12-21 NOTE — Telephone Encounter (Signed)
Pt responded "no" to all screening questions °

## 2018-12-24 ENCOUNTER — Encounter: Payer: Self-pay | Admitting: Internal Medicine

## 2018-12-24 ENCOUNTER — Ambulatory Visit (AMBULATORY_SURGERY_CENTER): Payer: 59 | Admitting: Internal Medicine

## 2018-12-24 ENCOUNTER — Encounter: Payer: 59 | Admitting: Internal Medicine

## 2018-12-24 VITALS — BP 103/66 | HR 67 | Temp 98.5°F | Resp 10 | Ht 63.0 in | Wt 125.0 lb

## 2018-12-24 DIAGNOSIS — K635 Polyp of colon: Secondary | ICD-10-CM

## 2018-12-24 DIAGNOSIS — K5150 Left sided colitis without complications: Secondary | ICD-10-CM | POA: Diagnosis not present

## 2018-12-24 DIAGNOSIS — K5100 Ulcerative (chronic) pancolitis without complications: Secondary | ICD-10-CM | POA: Diagnosis not present

## 2018-12-24 DIAGNOSIS — D123 Benign neoplasm of transverse colon: Secondary | ICD-10-CM

## 2018-12-24 DIAGNOSIS — K529 Noninfective gastroenteritis and colitis, unspecified: Secondary | ICD-10-CM | POA: Diagnosis not present

## 2018-12-24 MED ORDER — SODIUM CHLORIDE 0.9 % IV SOLN: 500.0000 mL | Freq: Once | INTRAVENOUS | Status: DC

## 2018-12-24 NOTE — Progress Notes (Signed)
Report to PACU, RN, vss, BBS= Clear.  

## 2018-12-24 NOTE — Op Note (Signed)
[place] Patient Name: Anna Berger Procedure Date: 12/24/2018 7:40 AM MRN: [id number] Endoscopist: Jerene Bears , MD Age: 43 Referring MD:  Date of Birth: 11/24/1975 Gender: Female Account #: [id number] Procedure:                Colonoscopy Indications:              High risk colon cancer surveillance: Ulcerative                            pancolitis of 8 (or more) years duration, Last                            colonoscopy: July 2018 Medicines:                Monitored Anesthesia Care Procedure:                Pre-Anesthesia Assessment:                           - Prior to the procedure, a History and Physical                            was performed, and patient medications and                            allergies were reviewed. The patient's tolerance of                            previous anesthesia was also reviewed. The risks                            and benefits of the procedure and the sedation                            options and risks were discussed with the patient.                            All questions were answered, and informed consent                            was obtained. Prior Anticoagulants: The patient has                            taken no previous anticoagulant or antiplatelet                            agents. ASA Grade Assessment: II - A patient with                            mild systemic disease. After reviewing the risks                            and benefits, the patient was deemed in  satisfactory condition to undergo the procedure.                           After obtaining informed consent, the colonoscope                            was passed under direct vision. Throughout the                            procedure, the patient's blood pressure, pulse, and                            oxygen saturations were monitored continuously. The                            Colonoscope was introduced through the anus  and                            advanced to the cecum, identified by appendiceal                            orifice and ileocecal valve. The colonoscopy was                            performed without difficulty. The patient tolerated                            the procedure well. The quality of the bowel                            preparation was good. The ileocecal valve,                            appendiceal orifice, and rectum were photographed. Scope In: 7:46:04 AM Scope Out: 8:02:00 AM Scope Withdrawal Time: 0 hours 12 minutes 25 seconds  Total Procedure Duration: 0 hours 15 minutes 56 seconds  Findings:                 The perianal and digital rectal examinations were                            normal.                           Inflammation was found in a continuous and                            circumferential pattern from the rectum to the                            cecum. This was graded as Mayo Score 1 (mild, with                            erythema, decreased vascular pattern, mild  friability), and when compared to the previous                            examination, the findings are improved. Four                            biopsies were taken every 10 cm with a cold forceps                            from the entire colon for dysplasia surveillance                            and ulcerative colitis surveillance. These biopsy                            specimens from the right colon and left colon were                            sent to Pathology.                           A 6 mm polyp was found in the hepatic flexure. The                            polyp was sessile. The polyp was removed with a                            cold snare. Resection and retrieval were complete.                           Retroflexion in the rectum was not performed due to                            anatomy. Complications:            No immediate  complications. Estimated Blood Loss:     Estimated blood loss was minimal. Impression:               - Mild (Mayo Score 1) pancolitis ulcerative                            colitis, improved since the last examination.                            Biopsied.                           - One 6 mm polyp at the hepatic flexure, removed                            with a cold snare. Resected and retrieved. Recommendation:           - Patient has a contact number available for  emergencies. The signs and symptoms of potential                            delayed complications were discussed with the                            patient. Return to normal activities tomorrow.                            Written discharge instructions were provided to the                            patient.                           - Resume previous diet.                           - Continue present medications. Overall, findings                            today are improved which fits with recent clinical                            picture. Continue Apriso 1.5 g daily.                           - Await pathology results.                           - Repeat colonoscopy is recommended for                            surveillance. The colonoscopy date will be                            determined after pathology results from today's                            exam become available for review. Jerene Bears, MD 12/24/2018 8:10:00 AM This report has been signed electronically.

## 2018-12-24 NOTE — Patient Instructions (Signed)
Handout given for polyps.  Continue Apriso 1.5 g daily.  You'll need another colonoscopy based on the results of today's procedure.  YOU HAD AN ENDOSCOPIC PROCEDURE TODAY AT [facility]:   Refer to the procedure report that was given to you for any specific questions about what was found during the examination.  If the procedure report does not answer your questions, please call your gastroenterologist to clarify.  If you requested that your care partner not be given the details of your procedure findings, then the procedure report has been included in a sealed envelope for you to review at your convenience later.  YOU SHOULD EXPECT: Some feelings of bloating in the abdomen. Passage of more gas than usual.  Walking can help get rid of the air that was put into your GI tract during the procedure and reduce the bloating. If you had a lower endoscopy (such as a colonoscopy or flexible sigmoidoscopy) you may notice spotting of blood in your stool or on the toilet paper. If you underwent a bowel prep for your procedure, you may not have a normal bowel movement for a few days.  Please Note:  You might notice some irritation and congestion in your nose or some drainage.  This is from the oxygen used during your procedure.  There is no need for concern and it should clear up in a day or so.  SYMPTOMS TO REPORT IMMEDIATELY:   Following lower endoscopy (colonoscopy or flexible sigmoidoscopy):  Excessive amounts of blood in the stool  Significant tenderness or worsening of abdominal pains  Swelling of the abdomen that is new, acute  Fever of 100F or higher   For urgent or emergent issues, a gastroenterologist can be reached at any hour by calling [phone].   DIET:  We do recommend a small meal at first, but then you may proceed to your regular diet.  Drink plenty of fluids but you should avoid alcoholic beverages for 24 hours.  ACTIVITY:  You should plan to take it easy for the  rest of today and you should NOT DRIVE or use heavy machinery until tomorrow (because of the sedation medicines used during the test).    FOLLOW UP: Our staff will call the number listed on your records 48-72 hours following your procedure to check on you and address any questions or concerns that you may have regarding the information given to you following your procedure. If we do not reach you, we will leave a message.  We will attempt to reach you two times.  During this call, we will ask if you have developed any symptoms of COVID 19. If you develop any symptoms (ie: fever, flu-like symptoms, shortness of breath, cough etc.) before then, please call [phone].  If you test positive for Covid 19 in the 2 weeks post procedure, please call and report this information to [place].    If any biopsies were taken you will be contacted by phone or by letter within the next 1-3 weeks.  Please call us at [phone] if you have not heard about the biopsies in 3 weeks.    SIGNATURES/CONFIDENTIALITY: You and/or your care partner have signed paperwork which will be entered into your electronic medical record.  These signatures attest to the fact that that the information above on your After Visit Summary has been reviewed and is understood.  Full responsibility of the confidentiality of this discharge information lies with you and/or your care-partner.

## 2018-12-24 NOTE — Progress Notes (Signed)
Temps by MB and vitals by NS

## 2018-12-24 NOTE — Progress Notes (Signed)
Called to room to assist during endoscopic procedure.  Patient ID and intended procedure confirmed with present staff. Received instructions for my participation in the procedure from the performing physician.  

## 2018-12-26 ENCOUNTER — Telehealth: Payer: Self-pay

## 2018-12-26 NOTE — Telephone Encounter (Signed)
  Follow up Call-  Call back number 12/24/2018 12/26/2016  Post procedure Call Back phone  # [phone]  Permission to leave phone message Yes Yes  Some recent data might be hidden     Patient questions:  Do you have a fever, pain , or abdominal swelling? No. Pain Score  0 *  Have you tolerated food without any problems? Yes.    Have you been able to return to your normal activities? Yes.    Do you have any questions about your discharge instructions: Diet   No. Medications  No. Follow up visit  No.  Do you have questions or concerns about your Care? No.  Actions: * If pain score is 4 or above: No action needed, pain <4.

## 2018-12-26 NOTE — Telephone Encounter (Signed)
Attempted to reach patient for post-procedure f/u call. No answer. Left message for her to please not hesitate to call us if she has any questions/concerns regarding her care or if she develops any symptoms of COVID19 in the near future.

## 2018-12-29 ENCOUNTER — Encounter: Payer: Self-pay | Admitting: Internal Medicine

## 2019-01-13 ENCOUNTER — Other Ambulatory Visit: Payer: Self-pay | Admitting: Internal Medicine

## 2019-03-18 DIAGNOSIS — D649 Anemia, unspecified: Secondary | ICD-10-CM | POA: Insufficient documentation

## 2019-03-21 DIAGNOSIS — R7989 Other specified abnormal findings of blood chemistry: Secondary | ICD-10-CM | POA: Insufficient documentation

## 2019-04-19 ENCOUNTER — Other Ambulatory Visit: Payer: Self-pay | Admitting: Internal Medicine

## 2019-05-17 ENCOUNTER — Other Ambulatory Visit: Payer: Self-pay | Admitting: Internal Medicine

## 2019-08-04 ENCOUNTER — Other Ambulatory Visit: Payer: Self-pay | Admitting: Internal Medicine

## 2019-10-08 ENCOUNTER — Ambulatory Visit: Payer: 59 | Admitting: Physician Assistant

## 2019-10-16 ENCOUNTER — Other Ambulatory Visit: Payer: Self-pay

## 2019-10-16 ENCOUNTER — Ambulatory Visit: Payer: BC Managed Care – PPO | Admitting: Dermatology

## 2019-10-16 ENCOUNTER — Telehealth: Payer: Self-pay | Admitting: Dermatology

## 2019-10-16 DIAGNOSIS — D229 Melanocytic nevi, unspecified: Secondary | ICD-10-CM | POA: Diagnosis not present

## 2019-10-16 DIAGNOSIS — L811 Chloasma: Secondary | ICD-10-CM | POA: Diagnosis not present

## 2019-10-16 DIAGNOSIS — Z1283 Encounter for screening for malignant neoplasm of skin: Secondary | ICD-10-CM | POA: Diagnosis not present

## 2019-10-16 MED ORDER — TRI-LUMA 0.01-4-0.05 % EX CREA: 1.0000 "application " | TOPICAL_CREAM | Freq: Once | CUTANEOUS | 2 refills | Status: AC

## 2019-10-16 NOTE — Telephone Encounter (Signed)
Patient was seen in [facility] office today 10/16/19 and is calling to report that the prescription(for malasma-patient does not know the name of it) is too expensive. Patient says that Dr. [place] told her to call if the prescription was too expensive and [facility] would send a different prescription [facility] [place], [place]. Patient says to leave a detailed message if does not answer. Chart [phone]

## 2019-10-17 ENCOUNTER — Other Ambulatory Visit: Payer: Self-pay

## 2019-10-17 MED ORDER — TRETINOIN 0.025 % EX CREA: TOPICAL_CREAM | Freq: Every evening | CUTANEOUS | 0 refills | Status: DC

## 2019-10-17 NOTE — Telephone Encounter (Signed)
Patient is aware of Dr Onalee Hua advice and medication change. Tretinoin 0.025% was sent to patients pharmacy. Due to patients age cash pay may be best for tretinoin.

## 2019-10-17 NOTE — Telephone Encounter (Signed)
Rx generic tretinoin 0.025% apply initially qon, if not too irritated in 2 weeks may increase to qhs. Also must get an over the counter 3% hydroquinone (Nadinola plus or other) to apply every night (before tretinoin) for minimal 10 weeks. Every morning 350+ spf SUNBLOCK.

## 2019-10-21 ENCOUNTER — Encounter: Payer: Self-pay | Admitting: Dermatology

## 2019-10-21 NOTE — Progress Notes (Signed)
   Follow-Up Visit   Subjective  Anna Berger is a 44 y.o. female who presents for the following: Annual Exam (Patient has moles on back and husband said one has changed colors and looks different. Also here to check her acne on face. ).  Moles Location: All over Duration: Most present for years Quality: Stable with possible change noted by has been in 1 spot on back Associated Signs/Symptoms: Modifying Factors:  Severity:  Timing: Context:   The following portions of the chart were reviewed this encounter and updated as appropriate: Tobacco  Allergies  Meds  Problems  Med Hx  Surg Hx  Fam Hx      Objective  Well appearing patient in no apparent distress;  mood and affect are within normal limits.  A full examination was performed including scalp, head, eyes, ears, nose, lips, neck, chest, axillae, abdomen, back, buttocks, bilateral upper extremities, bilateral lower extremities, hands, feet, fingers, toes, fingernails, and toenails. All findings within normal limits unless otherwise noted below.   Assessment & Plan  Melasma (2) Left Malar Cheek; Right Malar Cheek  Generic Tri-Luma if cost is nonprohibitive, Aveline nightly for 10 weeks.  Must use a #50+ SPF every morning.  Nevus Left Upper Back Skin cancer screening performed today.

## 2019-12-05 ENCOUNTER — Other Ambulatory Visit: Payer: Self-pay | Admitting: Internal Medicine

## 2019-12-24 ENCOUNTER — Other Ambulatory Visit: Payer: Self-pay | Admitting: Internal Medicine

## 2020-01-22 ENCOUNTER — Encounter: Payer: Self-pay | Admitting: Gastroenterology

## 2020-01-22 ENCOUNTER — Ambulatory Visit: Payer: BC Managed Care – PPO | Admitting: Gastroenterology

## 2020-01-22 VITALS — BP 102/64 | HR 66 | Ht 63.0 in | Wt 130.0 lb

## 2020-01-22 DIAGNOSIS — K51919 Ulcerative colitis, unspecified with unspecified complications: Secondary | ICD-10-CM | POA: Diagnosis not present

## 2020-01-22 DIAGNOSIS — E539 Vitamin B deficiency, unspecified: Secondary | ICD-10-CM

## 2020-01-22 MED ORDER — MESALAMINE ER 0.375 G PO CP24: ORAL_CAPSULE | ORAL | 3 refills | Status: DC

## 2020-01-22 MED ORDER — CYANOCOBALAMIN 1000 MCG/ML IJ SOLN: INTRAMUSCULAR | 11 refills | Status: DC

## 2020-01-22 NOTE — Progress Notes (Signed)
01/22/2020 HOA BRIGGS [id number] 1975-09-26   HISTORY OF PRESENT ILLNESS: This is a 44 year old female who is a patient of Dr. Vena Rua. She has a history of longstanding pan ulcerative colitis, GERD, vitamin B12 and vitamin D deficiency, factor V Leiden deficiency who is here for follow-up. She was last seen via virtual visit on Oct 15, 2018. Her last colonoscopy was July 2020 at which time she was found to have mild pan ulcerative colitis and 1 polyp that was removed. Biopsies showed mildly active chronic colitis in the right colon without dysplasia and inactive chronic colitis without dysplasia in the left colon. Polyp was a sessile serrated polyp. Repeat was recommended a 2-year interval. She is currently on Apriso 1.5 g daily. She takes vitamin B12 injections monthly. She says she is doing very well. She has 1-2 formed bowel movements daily. She denies seeing blood in her stools. Very rare abdominal pain, nothing persistent or consistent. For acid reflux she is still taking over-the-counter Nexium 20 mg daily and is still doing very well with that.   Past Medical History:  Diagnosis Date  . Allergy   . Blood dyscrasia    Factor V mutation  . Clotting disorder ([place])   . Endometritis   . Factor V deficiency ([place])   . Family history of coronary artery disease   . Family history of malignant neoplasm of gastrointestinal tract   . GERD (gastroesophageal reflux disease)   . Hypercholesteremia   . Hyperlipemia   . Hyperlipidemia   . Insomnia, unspecified   . Iron deficiency anemia   . Rapid palpitations   . UC (ulcerative colitis) ([place])   . Unspecified vitamin D deficiency   . Vitamin B12 deficiency    Past Surgical History:  Procedure Laterality Date  . CESAREAN SECTION  02/2006  . CESAREAN SECTION N/A 02/17/2014   Procedure: REPEAT CESAREAN SECTION;  Surgeon: Delice Lesch, MD;  Location: [facility];  Service: Obstetrics;  Laterality: N/A;  . COLONOSCOPY    . OVARIAN CYST  REMOVAL  2012  . POLYPECTOMY    . SCAR REVISION N/A 02/17/2014   Procedure: SCAR REVISION;  Surgeon: Delice Lesch, MD;  Location: [facility];  Service: Obstetrics;  Laterality: N/A;    reports that she has never smoked. She has never used smokeless tobacco. She reports current alcohol use. She reports that she does not use drugs. family history includes Colon cancer in her maternal uncle; Colon polyps in her paternal aunt; Coronary artery disease in her father; Crohn's disease in her maternal uncle; Diabetes in her maternal uncle; Heart attack (age of onset: 80) in her father; Hyperlipidemia in her brother and father; Hypertension in her father; Ulcerative colitis in her sister. No Known Allergies    Outpatient Encounter Medications as of 01/22/2020  Medication Sig  . atorvastatin (LIPITOR) 80 MG tablet Take 80 mg by mouth daily.  Marland Kitchen BIOTIN PO Take 1 tablet by mouth daily.   . cyanocobalamin (,VITAMIN B-12,) 1000 MCG/ML injection INJECT 1 ML INTO THE MUSCLE EVERY 30 DAYS. MUST HAVE OFFICE VISIT FOR FURTHER REFILLS  . esomeprazole (NEXIUM) 20 MG capsule Take 20 mg by mouth daily at 12 noon.  . ezetimibe (ZETIA) 10 MG tablet Take 10 mg by mouth at bedtime.  . magnesium oxide (MAG-OX) 400 MG tablet Take 400 mg by mouth daily.  . mesalamine (APRISO) 0.375 g 24 hr capsule TAKE 4 CAPSULES (1.5 G TOTAL) BY MOUTH DAILY. **NEED OFFICE VISIT**  .  traZODone (DESYREL) 100 MG tablet Take 50 mg by mouth at bedtime.  . [DISCONTINUED] atorvastatin (LIPITOR) 40 MG tablet Take 40 mg by mouth daily.  . [DISCONTINUED] tretinoin (RETIN-A) 0.025 % cream Apply topically at bedtime. (Patient not taking: Reported on 01/22/2020)   No facility-administered encounter medications on file as of 01/22/2020.     REVIEW OF SYSTEMS  : All other systems reviewed and negative except where noted in the History of Present Illness.   PHYSICAL EXAM: BP 102/64   Pulse 66   Ht 5' 3"  (1.6 m)   Wt 130 lb (59 kg)   SpO2 99%   BMI  23.03 kg/m  General: Well developed white female in no acute distress Head: Normocephalic and atraumatic Eyes:  Sclerae anicteric, conjunctiva pink. Ears: Normal auditory acuity Lungs: Clear throughout to auscultation; no increased WOB. Heart: Regular rate and rhythm; no M/R/G. Abdomen: Soft, non-distended.  BS present.  Non-tender. Musculoskeletal: Symmetrical with no gross deformities  Skin: No lesions on visible extremities Extremities: No edema  Neurological: Alert oriented x 4, grossly non-focal Psychological:  Alert and cooperative. Normal mood and affect  ASSESSMENT AND PLAN: 44 year old female with a history of longstanding pan ulcerative colitis, GERD, B12 and vitamin D deficiency, factor V Leiden deficiency who is here for follow-up.   1.  Longstanding pan ulcerative colitis --clinical remission. --Continue Apriso 1.5 g daily --Surveillance colonoscopy July 2022.  2.  Vitamin B12 deficiency:  Continue monthly injections.    3.  GERD --well-controlled on Nexium OTC.  Continue 20 mg daily.  No alarm symptoms.  CC:  Aura Dials, PA-C

## 2020-01-22 NOTE — Patient Instructions (Signed)
We have sent the following medications to your pharmacy for you to pick up at your convenience:  Apriso, Vitamin B 12

## 2020-01-27 NOTE — Progress Notes (Signed)
Addendum: Reviewed and agree with assessment and management plan. Isidoro Santillana M, MD  

## 2020-04-28 DIAGNOSIS — M26629 Arthralgia of temporomandibular joint, unspecified side: Secondary | ICD-10-CM | POA: Insufficient documentation

## 2021-01-05 ENCOUNTER — Ambulatory Visit (AMBULATORY_SURGERY_CENTER): Payer: BC Managed Care – PPO | Admitting: *Deleted

## 2021-01-05 ENCOUNTER — Other Ambulatory Visit: Payer: Self-pay

## 2021-01-05 VITALS — Ht 63.0 in | Wt 130.0 lb

## 2021-01-05 DIAGNOSIS — K51919 Ulcerative colitis, unspecified with unspecified complications: Secondary | ICD-10-CM

## 2021-01-05 MED ORDER — SUTAB 1479-225-188 MG PO TABS: 1.0000 | ORAL_TABLET | Freq: Once | ORAL | 0 refills | Status: AC

## 2021-01-05 NOTE — Progress Notes (Signed)
Virtual pre-visit completed. Instructions sent through e-mail [email or web address]  No egg or soy allergy known to patient  No issues with past sedation with any surgeries or procedures Patient denies ever being told they had issues or difficulty with intubation  No FH of Malignant Hyperthermia No diet pills per patient No home 02 use per patient  No blood thinners per patient  Pt denies issues with constipation  No A fib or A flutter  EMMI video to pt or via MyChart  COVID 19 guidelines implemented in [place] today with Pt and RN    Code to Pharmacy and  NO PA's for preps discussed with pt In [place] today  Discussed with pt there will be an out-of-pocket cost for prep and that varies from $0 to 70 dollars   Due to the COVID-19 pandemic we are asking patients to follow certain guidelines.  Pt aware of COVID protocols and [facility] guidelines

## 2021-01-26 ENCOUNTER — Encounter: Payer: Self-pay | Admitting: Internal Medicine

## 2021-01-28 ENCOUNTER — Other Ambulatory Visit: Payer: Self-pay

## 2021-01-28 ENCOUNTER — Encounter: Payer: Self-pay | Admitting: Internal Medicine

## 2021-01-28 ENCOUNTER — Ambulatory Visit (AMBULATORY_SURGERY_CENTER): Payer: BC Managed Care – PPO | Admitting: Internal Medicine

## 2021-01-28 VITALS — BP 111/63 | HR 64 | Temp 97.8°F | Resp 17 | Ht 63.0 in | Wt 130.0 lb

## 2021-01-28 DIAGNOSIS — K5140 Inflammatory polyps of colon without complications: Secondary | ICD-10-CM | POA: Diagnosis not present

## 2021-01-28 DIAGNOSIS — K5100 Ulcerative (chronic) pancolitis without complications: Secondary | ICD-10-CM

## 2021-01-28 DIAGNOSIS — K5150 Left sided colitis without complications: Secondary | ICD-10-CM | POA: Diagnosis not present

## 2021-01-28 DIAGNOSIS — D122 Benign neoplasm of ascending colon: Secondary | ICD-10-CM

## 2021-01-28 DIAGNOSIS — Z86010 Personal history of colonic polyps: Secondary | ICD-10-CM

## 2021-01-28 MED ORDER — SODIUM CHLORIDE 0.9 % IV SOLN
500.0000 mL | Freq: Once | INTRAVENOUS | Status: DC
Start: 2021-01-28 — End: 2021-01-28

## 2021-01-28 NOTE — Patient Instructions (Signed)
Handout on polyps given to you today Await pathology results  Office visit to be scheduled in about 6 months   YOU HAD AN ENDOSCOPIC PROCEDURE TODAY AT [place]:   Refer to the procedure report that was given to you for any specific questions about what was found during the examination.  If the procedure report does not answer your questions, please call your gastroenterologist to clarify.  If you requested that your care partner not be given the details of your procedure findings, then the procedure report has been included in a sealed envelope for you to review at your convenience later.  YOU SHOULD EXPECT: Some feelings of bloating in the abdomen. Passage of more gas than usual.  Walking can help get rid of the air that was put into your GI tract during the procedure and reduce the bloating. If you had a lower endoscopy (such as a colonoscopy or flexible sigmoidoscopy) you may notice spotting of blood in your stool or on the toilet paper. If you underwent a bowel prep for your procedure, you may not have a normal bowel movement for a few days.  Please Note:  You might notice some irritation and congestion in your nose or some drainage.  This is from the oxygen used during your procedure.  There is no need for concern and it should clear up in a day or so.  SYMPTOMS TO REPORT IMMEDIATELY:  Following lower endoscopy (colonoscopy or flexible sigmoidoscopy):  Excessive amounts of blood in the stool  Significant tenderness or worsening of abdominal pains  Swelling of the abdomen that is new, acute  Fever of 100F or higher  For urgent or emergent issues, a gastroenterologist can be reached at any hour by calling [phone]. Do not use MyChart messaging for urgent concerns.    DIET:  We do recommend a small meal at first, but then you may proceed to your regular diet.  Drink plenty of fluids but you should avoid alcoholic beverages for 24 hours.  ACTIVITY:  You should  plan to take it easy for the rest of today and you should NOT DRIVE or use heavy machinery until tomorrow (because of the sedation medicines used during the test).    FOLLOW UP: Our staff will call the number listed on your records 48-72 hours following your procedure to check on you and address any questions or concerns that you may have regarding the information given to you following your procedure. If we do not reach you, we will leave a message.  We will attempt to reach you two times.  During this call, we will ask if you have developed any symptoms of COVID 19. If you develop any symptoms (ie: fever, flu-like symptoms, shortness of breath, cough etc.) before then, please call [phone].  If you test positive for Covid 19 in the 2 weeks post procedure, please call and report this information to [place].    If any biopsies were taken you will be contacted by phone or by letter within the next 1-3 weeks.  Please call us at [phone] if you have not heard about the biopsies in 3 weeks.    SIGNATURES/CONFIDENTIALITY: You and/or your care partner have signed paperwork which will be entered into your electronic medical record.  These signatures attest to the fact that that the information above on your After Visit Summary has been reviewed and is understood.  Full responsibility of the confidentiality of this discharge information lies with you and/or  your care-partner.

## 2021-01-28 NOTE — Progress Notes (Signed)
Called to room to assist during endoscopic procedure.  Patient ID and intended procedure confirmed with present staff. Received instructions for my participation in the procedure from the performing physician.  

## 2021-01-28 NOTE — Op Note (Signed)
Anna Berger Patient Name: Anna Berger Procedure Date: 01/28/2021 9:53 AM MRN: [id number] Endoscopist: Jerene Bears , MD Age: 45 Referring MD:  Date of Birth: [birth year] Gender: Female Account #: [id number] Procedure:                Colonoscopy Indications:              High risk colon cancer surveillance: Ulcerative                            pancolitis of 8 (or more) years duration, Last                            colonoscopy: July 2020 Medicines:                Monitored Anesthesia Care Procedure:                Pre-Anesthesia Assessment:                           - Prior to the procedure, a History and Physical                            was performed, and patient medications and                            allergies were reviewed. The patient's tolerance of                            previous anesthesia was also reviewed. The risks                            and benefits of the procedure and the sedation                            options and risks were discussed with the patient.                            All questions were answered, and informed consent                            was obtained. Prior Anticoagulants: The patient has                            taken no previous anticoagulant or antiplatelet                            agents. ASA Grade Assessment: II - A patient with                            mild systemic disease. After reviewing the risks                            and benefits, the patient was deemed in  satisfactory condition to undergo the procedure.                           After obtaining informed consent, the colonoscope                            was passed under direct vision. Throughout the                            procedure, the patient's blood pressure, pulse, and                            oxygen saturations were monitored continuously. The                            Olympus PCF-H190DL (#[id number]) Colonoscope was                             introduced through the anus and advanced to the                            cecum, identified by appendiceal orifice and                            ileocecal valve. The colonoscopy was performed                            without difficulty. The patient tolerated the                            procedure well. The quality of the bowel                            preparation was good. The ileocecal valve,                            appendiceal orifice, and rectum were photographed. Scope In: 10:12:57 AM Scope Out: 10:31:23 AM Scope Withdrawal Time: 0 hours 15 minutes 26 seconds  Total Procedure Duration: 0 hours 18 minutes 26 seconds  Findings:                 The digital rectal exam was normal.                           Inflammation was found in a continuous and                            circumferential pattern from the rectum to the                            cecum. This was graded as Mayo Score 1 (mild, with                            erythema, decreased vascular pattern, mild  friability), and when compared to the previous                            examination, the findings are unchanged. Four                            biopsies were taken every 10 cm with a cold forceps                            from the entire colon for dysplasia surveillance                            and ulcerative colitis surveillance. These biopsy                            specimens from the right colon and left colon were                            sent to Pathology. Featureless left colon in                            setting of chronic UC.                           Two sessile polyps were found in the ascending                            colon. The polyps were 5 to 8 mm in size. These                            polyps were removed with a cold snare. Resection                            and retrieval were complete.                           No additional  abnormalities were found on                            retroflexion. Complications:            No immediate complications. Estimated Blood Loss:     Estimated blood loss was minimal. Impression:               - Mild (Mayo Score 1) pancolitis ulcerative                            colitis, unchanged since the last examination.                            Biopsied.                           - Two 5 to 8 mm polyps in the ascending colon,  removed with a cold snare. Resected and retrieved. Recommendation:           - Patient has a contact number available for                            emergencies. The signs and symptoms of potential                            delayed complications were discussed with the                            patient. Return to normal activities tomorrow.                            Written discharge instructions were provided to the                            patient.                           - Resume previous diet.                           - Continue present medications.                           - Await pathology results.                           - Office visit with me in about 6 months.                           - Repeat colonoscopy is recommended for                            surveillance. The colonoscopy date will be                            determined after pathology results from today's                            exam become available for review. Jerene Bears, MD 01/28/2021 10:37:43 AM This report has been signed electronically.

## 2021-01-28 NOTE — Progress Notes (Signed)
A and O x3. Report to RN. Tolerated MAC anesthesia well. 

## 2021-01-28 NOTE — Progress Notes (Signed)
GASTROENTEROLOGY PROCEDURE H&P NOTE   Primary Care Physician: Aura Dials, PA-C    Reason for Procedure:  Longstanding ulcerative colitis  Plan:    Colonoscopy for surveillance  Patient is appropriate for endoscopic procedure(s) in the ambulatory ([place]) setting.  The nature of the procedure, as well as the risks, benefits, and alternatives were carefully and thoroughly reviewed with the patient. Ample time for discussion and questions allowed. The patient understood, was satisfied, and agreed to proceed.     HPI: Anna Berger is a 45 y.o. female who presents for surveillance colonoscopy in the setting of chronic ulcerative colitis.  No recent colitis symptoms.  She is feeling well today.  Tolerated the pills for prep and felt this was better than the old liquid prep.  No complaints today including no chest pain or dyspnea.  Past Medical History:  Diagnosis Date   Allergy    Blood dyscrasia    Factor V mutation   Clotting disorder ([facility])    Endometritis    Factor V deficiency ([facility])    Family history of coronary artery disease    Family history of malignant neoplasm of gastrointestinal tract    GERD (gastroesophageal reflux disease)    Hypercholesteremia    Hyperlipemia    Hyperlipidemia    Insomnia, unspecified    Iron deficiency anemia    Rapid palpitations    UC (ulcerative colitis) ([place])    Unspecified vitamin D deficiency    Vitamin B12 deficiency     Past Surgical History:  Procedure Laterality Date   CESAREAN SECTION  02/2006   CESAREAN SECTION N/A 02/17/2014   Procedure: REPEAT CESAREAN SECTION;  Surgeon: Anna Lesch, MD;  Location: [facility];  Service: Obstetrics;  Laterality: N/A;   COLONOSCOPY     OVARIAN CYST REMOVAL  2012   POLYPECTOMY     SCAR REVISION N/A 02/17/2014   Procedure: SCAR REVISION;  Surgeon: Anna Lesch, MD;  Location: [facility];  Service: Obstetrics;  Laterality: N/A;    Prior to Admission medications   Medication Sig Start  Date End Date Taking? Authorizing Provider  atorvastatin (LIPITOR) 80 MG tablet Take 80 mg by mouth daily.   Yes [provider]  BIOTIN PO Take 1 tablet by mouth daily.    Yes [provider]  ezetimibe (ZETIA) 10 MG tablet Take 10 mg by mouth at bedtime.   Yes [provider]  magnesium oxide (MAG-OX) 400 MG tablet Take 400 mg by mouth daily.   Yes [provider]  mesalamine (APRISO) 0.375 g 24 hr capsule TAKE 4 CAPSULES (1.5 G TOTAL) BY MOUTH DAILY. 01/22/20  Yes Zehr, Laban Emperor, PA-C  traZODone (DESYREL) 100 MG tablet Take 50 mg by mouth at bedtime.   Yes [provider]  cyanocobalamin (,VITAMIN B-12,) 1000 MCG/ML injection INJECT 1 ML INTO THE MUSCLE EVERY 30 DAYS. 01/22/20   Zehr, Laban Emperor, PA-C  esomeprazole (NEXIUM) 20 MG capsule Take 20 mg by mouth daily at 12 noon.    [provider]    Current Outpatient Medications  Medication Sig Dispense Refill   atorvastatin (LIPITOR) 80 MG tablet Take 80 mg by mouth daily.     BIOTIN PO Take 1 tablet by mouth daily.      ezetimibe (ZETIA) 10 MG tablet Take 10 mg by mouth at bedtime.     magnesium oxide (MAG-OX) 400 MG tablet Take 400 mg by mouth daily.     mesalamine (APRISO) 0.375 g  24 hr capsule TAKE 4 CAPSULES (1.5 G TOTAL) BY MOUTH DAILY. 360 capsule 3   traZODone (DESYREL) 100 MG tablet Take 50 mg by mouth at bedtime.     cyanocobalamin (,VITAMIN B-12,) 1000 MCG/ML injection INJECT 1 ML INTO THE MUSCLE EVERY 30 DAYS. 1 mL 11   esomeprazole (NEXIUM) 20 MG capsule Take 20 mg by mouth daily at 12 noon.     Current Facility-Administered Medications  Medication Dose Route Frequency Provider Last Rate Last Admin   0.9 %  sodium chloride infusion  500 mL Intravenous Once Johnel Yielding, Lajuan Lines, MD        Allergies as of 01/28/2021   (No Known Allergies)    Family History  Problem Relation Age of Onset   Hypertension Father    Coronary artery disease Father    Heart attack Father 76    Hyperlipidemia Father    Hyperlipidemia Brother    Crohn's disease Maternal Uncle    Colon cancer Maternal Uncle    Colon polyps Paternal Aunt    Diabetes Maternal Uncle    Ulcerative colitis Sister    Esophageal cancer Neg Hx    Rectal cancer Neg Hx    Stomach cancer Neg Hx     Social History   Socioeconomic History   Marital status: Married    Spouse name: Not on file   Number of children: 2   Years of education: Not on file   Highest education level: Not on file  Occupational History   Occupation: [employer]     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  Tobacco Use   Smoking status: Never   Smokeless tobacco: Never  Vaping Use   Vaping Use: Never used  Substance and Sexual Activity   Alcohol use: Yes    Alcohol/week: 0.0 standard drinks    Comment: social   Drug use: No   Sexual activity: Yes  Other Topics Concern   Not on file  Social History Narrative   Not on file   Social Determinants of Health   Financial Resource Strain: Not on file  Food Insecurity: Not on file  Transportation Needs: Not on file  Physical Activity: Not on file  Stress: Not on file  Social Connections: Not on file  Intimate Partner Violence: Not on file    Physical Exam: Vital signs in last 24 hours: @BP  134/85   Pulse 70   Temp 97.8 F (36.6 C) (Temporal)   Resp 14   Ht 5' 3"  (1.6 m)   Wt 130 lb (59 kg)   SpO2 99%   BMI 23.03 kg/m  GEN: NAD EYE: Sclerae anicteric ENT: MMM CV: Non-tachycardic Pulm: CTA b/l GI: Soft, NT/ND NEURO:  Alert & Oriented x 3   Anna Jarred, MD [facility]  01/28/2021 10:04 AM

## 2021-01-28 NOTE — Progress Notes (Signed)
VS- Nash Mantis  Pt's states no medical or surgical changes since previsit or office visit.

## 2021-02-01 ENCOUNTER — Telehealth: Payer: Self-pay

## 2021-02-01 NOTE — Telephone Encounter (Signed)
  Follow up Call-  Call back number 01/28/2021 12/24/2018  Post procedure Call Back phone  # [phone]  Permission to leave phone message Yes Yes  Some recent data might be hidden     Patient questions:  Do you have a fever, pain , or abdominal swelling? No. Pain Score  0 *  Have you tolerated food without any problems? Yes.    Have you been able to return to your normal activities? Yes.    Do you have any questions about your discharge instructions: Diet   No. Medications  No. Follow up visit  No.  Do you have questions or concerns about your Care? No.  Actions: * If pain score is 4 or above: No action needed, pain <4.  Have you developed a fever since your procedure? no  2.   Have you had an respiratory symptoms (SOB or cough) since your procedure? no  3.   Have you tested positive for COVID 19 since your procedure no  4.   Have you had any family members/close contacts diagnosed with the COVID 19 since your procedure?  no   If yes to any of these questions please route to Joylene John, RN and Joella Prince, RN

## 2021-02-04 ENCOUNTER — Encounter: Payer: Self-pay | Admitting: Internal Medicine

## 2021-02-08 ENCOUNTER — Encounter: Payer: Self-pay | Admitting: Internal Medicine

## 2021-02-08 ENCOUNTER — Other Ambulatory Visit: Payer: Self-pay | Admitting: Gastroenterology

## 2021-02-27 ENCOUNTER — Other Ambulatory Visit: Payer: Self-pay | Admitting: Gastroenterology

## 2021-06-11 ENCOUNTER — Telehealth: Payer: Self-pay | Admitting: *Deleted

## 2021-06-11 NOTE — Telephone Encounter (Addendum)
Patient indicates that Apriso/mesalamine is now going to be $300.00 per month for her. She would like to know if we could send her an alternative. Looks like insurance prefers sulfasalazine or balsalazide. Please advise.Marland KitchenMarland Kitchen

## 2021-06-11 NOTE — Telephone Encounter (Signed)
Patient would first like to see if the prior authorization attempted this morning will bring the cost of mesalamine down. Otherwise, will try balsalazide. Currently awaiting insurance reply regarding mesalamine coverage.

## 2021-06-11 NOTE — Telephone Encounter (Signed)
Can try change to balsalazide 1.5 g BID She should let me know if this causes any change in her colitis control

## 2021-06-15 MED ORDER — BALSALAZIDE DISODIUM 750 MG PO CAPS: 1500.0000 mg | ORAL_CAPSULE | Freq: Two times a day (BID) | ORAL | 0 refills | Status: DC

## 2021-06-15 NOTE — Telephone Encounter (Signed)
Insurance indicates that Anna Berger is covered without prior authorization. Patient is agreeable to changing to balsalazide. Rx sent for balsalazide 1.5 g twice daily.

## 2021-07-30 ENCOUNTER — Ambulatory Visit: Payer: BC Managed Care – PPO | Admitting: Internal Medicine

## 2021-08-08 ENCOUNTER — Other Ambulatory Visit: Payer: Self-pay | Admitting: Gastroenterology

## 2021-09-06 ENCOUNTER — Other Ambulatory Visit: Payer: Self-pay | Admitting: Internal Medicine

## 2022-02-08 ENCOUNTER — Other Ambulatory Visit: Payer: Self-pay | Admitting: Gastroenterology

## 2022-05-04 ENCOUNTER — Other Ambulatory Visit: Payer: BC Managed Care – PPO

## 2022-05-04 ENCOUNTER — Encounter: Payer: Self-pay | Admitting: Internal Medicine

## 2022-05-04 ENCOUNTER — Ambulatory Visit: Payer: BC Managed Care – PPO | Admitting: Internal Medicine

## 2022-05-04 VITALS — BP 114/82 | HR 81 | Ht 63.0 in | Wt 140.2 lb

## 2022-05-04 DIAGNOSIS — K219 Gastro-esophageal reflux disease without esophagitis: Secondary | ICD-10-CM | POA: Diagnosis not present

## 2022-05-04 DIAGNOSIS — K5100 Ulcerative (chronic) pancolitis without complications: Secondary | ICD-10-CM | POA: Diagnosis not present

## 2022-05-04 DIAGNOSIS — E539 Vitamin B deficiency, unspecified: Secondary | ICD-10-CM

## 2022-05-04 MED ORDER — CYANOCOBALAMIN 1000 MCG/ML IJ SOLN: INTRAMUSCULAR | 1 refills | Status: DC

## 2022-05-04 NOTE — Progress Notes (Signed)
Subjective:    Patient ID: Anna Berger, female    DOB: 1976-02-11, 46 y.o.   MRN: [id number]  HPI Anna Berger is a 46 year old female with a history of longstanding pan ulcerative colitis currently on Apriso, GERD, B12 and vitamin D deficiency, factor V Leiden who is here for follow-up.  She is here today with her husband.  I last saw her in August 2022 for her surveillance colonoscopy.  Surveillance colonoscopy on 01/28/2021 revealed Mayo score 1 inflammation circumferential from the rectum to cecum.  There were 2 polyps in the ascending colon which were removed and found to be inflammatory.  Pathology from the right colon was benign with no active inflammation.  Left colon showed chronic colitis.  She reports that she is doing well though continues to have days of fecal urgency.  This is troublesome to her at times but she is put up with it for a long time.  Bowel movements vary from 2-3 times a day to as many as 5-6 times a day.  Occasional lower and left lower quadrant discomfort associated with this.  No blood in stool.  Occasional mucus.  She does take the Apriso 1.5 g daily.  When she has more frequent diarrhea she often notices mouth ulcers or sores as a joining symptom.  She does not have rash or ocular complaint.  She is run out of B12 a couple months ago and she knows that she has less energy without it.  Her sister has IBD and is doing well on infliximab. Jesusa has always worried a bit about infections associated with Biologics.  Review of Systems As per HPI, otherwise negative  Current Medications, Allergies, Past Medical History, Past Surgical History, Family History and Social History were reviewed in [employer] record.    Objective:   Physical Exam BP 114/82   Pulse 81   Ht 5' 3"  (1.6 m)   Wt 140 lb 4 oz (63.6 kg)   SpO2 97%   BMI 24.84 kg/m  Gen: awake, alert, NAD HEENT: anicteric  CV: RRR, no mrg Pulm: CTA b/l Abd: soft, NT/ND, +BS  throughout Ext: no c/c/e Neuro: nonfocal  Labs reviewed from care everywhere, blood counts, albumin, liver enzymes and creatinine are normal when checked last in May 2023     Assessment & Plan:  46 year old female with a history of longstanding pan ulcerative colitis currently on Apriso, GERD, B12 and vitamin D deficiency, factor V Leiden who is here for follow-up.  Longstanding pan ulcerative colitis --she is having intermittent symptoms consistent with active inflammation which is more frequent diarrhea, mucus and lower abdominal pain.  Also associated oral ulcerations.  This likely is related to active ulcerative colitis but cannot exclude that this irritable in nature.  We will do an objective test and decide on escalation of therapy.  If calprotectin is elevated I think she would be an excellent candidate for Entyvio.  We discussed this today regarding the risk and benefits and its safety profile.  I also anticipate it will become subcu very soon.  I would not recommend Rinvoq or Morrie Sheldon due to her history of factor V Leiden deficiency. -- Fecal calprotectin; if elevated consider Entyvio -- Continue Apriso 1.5 g daily  2.  GERD --currently well-controlled on over-the-counter Nexium she will continue 20 mg daily  3.  B12 deficiency --resume 1000 mcg of IM B12  30 minutes total spent today including patient facing time, coordination of care, reviewing medical history/procedures/pertinent radiology  studies, and documentation of the encounter.

## 2022-05-04 NOTE — Patient Instructions (Addendum)
If you are age 47 or younger, your body mass index should be between 19-25. Your Body mass index is 24.84 kg/m. If this is out of the aformentioned range listed, please consider follow up with your Primary Care Provider.  ________________________________________________________  The [facility] providers would like to encourage you to use [facility] to communicate with providers for non-urgent requests or questions.  Due to long hold times on the telephone, sending your provider a message by [facility] may be a faster and more efficient way to get a response.  Please allow 48 business hours for a response.  Please remember that this is for non-urgent requests.  _______________________________________________________  Your provider has requested that you go to the basement level for lab work before leaving today. Press "B" on the elevator. The lab is located at the first door on the left as you exit the elevator.  Due to recent changes in healthcare laws, you may see the results of your imaging and laboratory studies on MyChart before your provider has had a chance to review them.  We understand that in some cases there may be results that are confusing or concerning to you. Not all laboratory results come back in the same time frame and the provider may be waiting for multiple results in order to interpret others.  Please give [place] 48 hours in order for your provider to thoroughly review all the results before contacting the office for clarification of your results.   CONTINUE: Apriso 1.5g daily  Thank you for entrusting me with your care and choosing [facility].  Dr Hilarie Fredrickson

## 2022-05-18 ENCOUNTER — Other Ambulatory Visit: Payer: BC Managed Care – PPO

## 2022-05-18 DIAGNOSIS — E539 Vitamin B deficiency, unspecified: Secondary | ICD-10-CM

## 2022-05-18 DIAGNOSIS — K5100 Ulcerative (chronic) pancolitis without complications: Secondary | ICD-10-CM

## 2022-05-23 ENCOUNTER — Encounter: Payer: Self-pay | Admitting: Internal Medicine

## 2022-05-23 LAB — CALPROTECTIN, FECAL: Calprotectin, Fecal: 35 ug/g (ref 0–120)

## 2022-05-24 ENCOUNTER — Telehealth: Payer: Self-pay | Admitting: Pharmacy Technician

## 2022-05-24 ENCOUNTER — Other Ambulatory Visit: Payer: Self-pay | Admitting: Internal Medicine

## 2022-05-24 DIAGNOSIS — K5100 Ulcerative (chronic) pancolitis without complications: Secondary | ICD-10-CM

## 2022-05-24 NOTE — Progress Notes (Signed)
I would like standard IV induction therapy and then switch to home subcutaneous injection (newly approved) After 3 IV infusions please begin [place] 108 mg every 2 weeks

## 2022-05-24 NOTE — Telephone Encounter (Signed)
I am going to begin Entyvio with standard infusion induction but then plan for subcutaneous therapy every 2 weeks after induction I am not sure if you need to work on the subcutaneous portion but I will begin with the infusions with the [place] [facility]  Please let patient know we are working on this

## 2022-05-24 NOTE — Telephone Encounter (Signed)
Dr. Hilarie Fredrickson, Juluis Rainier note:  Auth Submission: APPROVED Payer: BCBS Medication & CPT/J Code(s) submitted: Entyvio (Vedolizumab) [phone] Route of submission (phone, fax, portal): COVER MY MEDS Phone # Fax # Auth type: Buy/Bill Units/visits requested: 2400 UNITS - 8 VISITS Reference number: [facility] Approval from: 05/24/22 to 05/25/23   GOAL: possibly transition patient to SQ. Entyvio 328m IV x3 doses then transition to SQ [id number].   @[place] ,  Please submit auth for entyvio SQ (Pharmacy benefit)  Entyvio co-pay card: Approved ID: [id number]: [id number]: [id number]: [id number]

## 2022-05-25 ENCOUNTER — Encounter: Payer: Self-pay | Admitting: Internal Medicine

## 2022-06-07 ENCOUNTER — Ambulatory Visit (INDEPENDENT_AMBULATORY_CARE_PROVIDER_SITE_OTHER): Payer: BC Managed Care – PPO

## 2022-06-07 VITALS — BP 113/76 | HR 68 | Temp 97.8°F | Resp 18 | Ht 63.0 in | Wt 147.2 lb

## 2022-06-07 DIAGNOSIS — K51019 Ulcerative (chronic) pancolitis with unspecified complications: Secondary | ICD-10-CM

## 2022-06-07 MED ORDER — VEDOLIZUMAB 300 MG IV SOLR
300.0000 mg | Freq: Once | INTRAVENOUS | Status: AC
  Administered 2022-06-07: 300 mg via INTRAVENOUS
  Filled 2022-06-07: qty 5

## 2022-06-07 NOTE — Progress Notes (Signed)
Diagnosis: Ulcerative pancolitis  Provider:  Marshell Garfinkel MD  Procedure: Infusion  IV Type: Peripheral, IV Location: R Forearm  Entyvio (Vedolizumab), Dose: 300 mg  Infusion Start Time: 4239  Infusion Stop Time: 5320  Post Infusion IV Care: Observation period completed and Peripheral IV Discontinued  Discharge: Condition: Good, Destination: Home . AVS provided to patient.   Performed by:  Cleophus Molt, RN

## 2022-06-07 NOTE — Patient Instructions (Signed)
Vedolizumab Injection ?What is this medication? ?VEDOLIZUMAB (ve doe LIZ you mab) treats inflammatory bowel diseases. It works by decreasing inflammation in the digestive tract. ?This medicine may be used for other purposes; ask your health care provider or pharmacist if you have questions. ?COMMON BRAND NAME(S): Entyvio ?What should I tell my care team before I take this medication? ?They need to know if you have any of these conditions: ?Immune system problems ?Infection, such a tuberculosis (TB) or other bacterial, fungal, or viral infections ?Liver disease ?Recent or upcoming vaccine ?An unusual or allergic reaction to vedolizumab, other medications, foods, dyes, or preservatives ?Pregnant or trying to get pregnant ?Breast-feeding ?How should I use this medication? ?This medication is injected into a vein. It is given by your care team in a hospital or clinic setting. ?A special MedGuide will be given to you by the pharmacist with each prescription and refill. Be sure to read this information carefully each time. ?Talk to your care team about the use of this medication in children. It is not approved for use in children. ?Overdosage: If you think you have taken too much of this medicine contact a poison control center or emergency room at once. ?NOTE: This medicine is only for you. Do not share this medicine with others. ?What if I miss a dose? ?It is important not to miss your dose. Call your care team if you are unable to keep an appointment. ?What may interact with this medication? ?Steroid medications, such as prednisone or cortisone ?TNF-alpha inhibitors, such as natalizumab, adalimumab, and infliximab ?Vaccines ?This list may not describe all possible interactions. Give your health care provider a list of all the medicines, herbs, non-prescription drugs, or dietary supplements you use. Also tell them if you smoke, drink alcohol, or use illegal drugs. Some items may interact with your medicine. ?What should  I watch for while using this medication? ?Visit your care team for regular checks on your progress. Tell your care team if your symptoms do not start to get better or if they get worse. ?Stay away from people who are sick. Call your care team for advice if you get a fever, chills or sore throat, or other symptoms of a cold or flu. Do not treat yourself. ?In some patients, this medication may cause a serious brain infection that may cause death. If you have any problems seeing, thinking, speaking, walking, or standing, tell your care team right away. If you cannot reach your care team, get urgent medical care. ?What side effects may I notice from receiving this medication? ?Side effects that you should report to your care team as soon as possible: ?Allergic reactions--skin rash, itching, hives, swelling of the face, lips, tongue, or throat ?Dizziness, loss of balance or coordination, confusion or trouble speaking ?Infection--fever, chills, cough, or sore throat ?Infusion reactions--chest pain, shortness of breath or trouble breathing, feeling faint or lightheaded ?Liver injury--right upper belly pain, loss of appetite, nausea, light-colored stool, dark yellow or brown urine, yellowing skin or eyes, unusual weakness or fatigue ?Side effects that usually do not require medical attention (report to your care team if they continue or are bothersome): ?Headache ?Fatigue ?Joint pain ?Nausea ?This list may not describe all possible side effects. Call your doctor for medical advice about side effects. You may report side effects to FDA at 1-800-FDA-1088. ?Where should I keep my medication? ?This medication is given in a hospital or clinic and will not be stored at home. ?NOTE: This sheet is a summary. It   may not cover all possible information. If you have questions about this medicine, talk to your doctor, pharmacist, or health care provider. ?? 2023 Elsevier/Gold Standard (2021-03-04 00:00:00) ? ?

## 2022-06-21 ENCOUNTER — Ambulatory Visit (INDEPENDENT_AMBULATORY_CARE_PROVIDER_SITE_OTHER): Payer: BC Managed Care – PPO

## 2022-06-21 VITALS — BP 100/66 | HR 56 | Temp 98.1°F | Resp 16 | Ht 63.0 in | Wt 142.4 lb

## 2022-06-21 DIAGNOSIS — K51019 Ulcerative (chronic) pancolitis with unspecified complications: Secondary | ICD-10-CM

## 2022-06-21 MED ORDER — VEDOLIZUMAB 300 MG IV SOLR
300.0000 mg | Freq: Once | INTRAVENOUS | Status: AC
  Administered 2022-06-21: 300 mg via INTRAVENOUS
  Filled 2022-06-21: qty 5

## 2022-06-21 NOTE — Progress Notes (Signed)
Diagnosis: Crohn's Disease  Provider:  Marshell Garfinkel MD  Procedure: Infusion  IV Type: Peripheral, IV Location: L Antecubital  Entyvio (Vedolizumab), Dose: 300 mg  Infusion Start Time: 7628  Infusion Stop Time: 3151  Post Infusion IV Care: Peripheral IV Discontinued  Discharge: Condition: Good, Destination: Home . AVS provided to patient.   Performed by:  Koren Shiver, RN

## 2022-07-19 ENCOUNTER — Ambulatory Visit: Payer: BC Managed Care – PPO

## 2022-07-26 ENCOUNTER — Ambulatory Visit (INDEPENDENT_AMBULATORY_CARE_PROVIDER_SITE_OTHER): Payer: BC Managed Care – PPO

## 2022-07-26 VITALS — BP 115/78 | HR 72 | Temp 98.0°F | Resp 16 | Ht 63.0 in | Wt 140.0 lb

## 2022-07-26 DIAGNOSIS — K51019 Ulcerative (chronic) pancolitis with unspecified complications: Secondary | ICD-10-CM | POA: Diagnosis not present

## 2022-07-26 MED ORDER — VEDOLIZUMAB 300 MG IV SOLR
300.0000 mg | Freq: Once | INTRAVENOUS | Status: AC
  Administered 2022-07-26: 300 mg via INTRAVENOUS
  Filled 2022-07-26: qty 5

## 2022-07-26 NOTE — Progress Notes (Signed)
Diagnosis: Crohn's Disease  Provider:  Marshell Garfinkel MD  Procedure: Infusion  IV Type: Peripheral, IV Location: L Antecubital  Entyvio (Vedolizumab), Dose: 300 mg  Infusion Start Time: [id number]  Infusion Stop Time: [id number]  Post Infusion IV Care: Peripheral IV Discontinued  Discharge: Condition: Good, Destination: Home . AVS Provided and AVS Declined  Performed by:  Adelina Mings, LPN

## 2022-09-13 ENCOUNTER — Telehealth: Payer: Self-pay | Admitting: Pharmacy Technician

## 2022-09-13 ENCOUNTER — Other Ambulatory Visit (HOSPITAL_COMMUNITY): Payer: Self-pay

## 2022-09-13 NOTE — Telephone Encounter (Signed)
Patient Advocate Encounter  Received notification from [facility] that prior authorization for ENTYVIO 108MG  is required.   PA submitted on 4.9.24 Key [id number] Status is pending

## 2022-09-14 ENCOUNTER — Encounter: Payer: Self-pay | Admitting: Internal Medicine

## 2022-09-20 ENCOUNTER — Other Ambulatory Visit (HOSPITAL_COMMUNITY): Payer: Self-pay

## 2022-09-20 ENCOUNTER — Ambulatory Visit (INDEPENDENT_AMBULATORY_CARE_PROVIDER_SITE_OTHER): Payer: BC Managed Care – PPO

## 2022-09-20 VITALS — BP 107/72 | HR 71 | Temp 98.0°F | Resp 16 | Ht 63.0 in | Wt 144.1 lb

## 2022-09-20 DIAGNOSIS — K51019 Ulcerative (chronic) pancolitis with unspecified complications: Secondary | ICD-10-CM

## 2022-09-20 MED ORDER — VEDOLIZUMAB 300 MG IV SOLR
300.0000 mg | Freq: Once | INTRAVENOUS | Status: AC
  Administered 2022-09-20: 300 mg via INTRAVENOUS
  Filled 2022-09-20: qty 5

## 2022-09-20 NOTE — Telephone Encounter (Signed)
This is for the [facility]

## 2022-09-20 NOTE — Telephone Encounter (Signed)
Left message for pt to call back  °

## 2022-09-20 NOTE — Telephone Encounter (Signed)
Patient Advocate Encounter  Prior Authorization for ENTYVIO has been approved.    PA# [id number] Effective dates: 4.10.24 through 4.10.25

## 2022-09-20 NOTE — Telephone Encounter (Signed)
Does she need to get it filled at a particular pharmacy?

## 2022-09-21 ENCOUNTER — Other Ambulatory Visit: Payer: Self-pay

## 2022-09-21 ENCOUNTER — Encounter: Payer: Self-pay | Admitting: Internal Medicine

## 2022-09-21 ENCOUNTER — Other Ambulatory Visit (HOSPITAL_COMMUNITY): Payer: Self-pay

## 2022-09-21 MED ORDER — ENTYVIO 108 MG/0.68ML ~~LOC~~ SOPN
108.0000 mg | PEN_INJECTOR | SUBCUTANEOUS | 11 refills | Status: DC
  Filled 2022-09-21: qty 0.68, fill #0

## 2022-09-21 NOTE — Telephone Encounter (Addendum)
Test billing results return a copay of $200. I have enrolled the patient in EntyvioConnect Co-Pay Program to reduce the copay down to $5. However, the copay card is with [facility] which has had some processing issues due to a breach a few months back. I have called the patient and informed who where we are with getting this processed and set up for delivery. I will continue to provide feed back and follow up with patient. Pt's next scheduled infusion is 6.18.24. My hope is that we get this resolved quickly so that patient will be able to use the pen instead.

## 2022-09-21 NOTE — Telephone Encounter (Signed)
Spoke with pt and she is aware. Script sent to [facility].

## 2022-09-21 NOTE — Telephone Encounter (Signed)
She can start Entyvio now, she needs the 3 standard IV infusions and then 2 weeks after the third infusion she starts the subcutaneous 108 mg injection every 2 weeks

## 2022-09-21 NOTE — Telephone Encounter (Signed)
Pt has been approved for [facility], she wanted to know when Dr. Rhea Belton would like her to start the injections.

## 2022-09-22 ENCOUNTER — Other Ambulatory Visit (HOSPITAL_COMMUNITY): Payer: Self-pay

## 2022-09-23 ENCOUNTER — Encounter: Payer: Self-pay | Admitting: Internal Medicine

## 2022-09-23 ENCOUNTER — Other Ambulatory Visit (HOSPITAL_COMMUNITY): Payer: Self-pay

## 2022-09-23 NOTE — Telephone Encounter (Addendum)
Call made to patient on home/mobile number. No answer so left HIPAA compliant message. Called the help desk number for the [facility] copay card. Was provided an alternative number to call [phone]). When checking patients eligibility, they said patient has insurance with [employer]. With that being a federal program, pt would not be eligible for the copay card or patient assistance. Called [facility] service [phone]) to see if they carried the product, and they do not. Would have to get at local pharmacy. Pt copay will be $200 per month.  Only other alternative may be seeing which [facility] pharmacy the patient uses or has used, send the prescription to them, and if they require a PA they will send the PA documentation back to [place] via fax and we can complete. If the it can be covered at the [place], the patient should be able to receive free of charge.   If/when patient calls back, please provide this update and ask about VA pharmacy to send script. There is still a script at [facility] alternatively, but again, copay would be $200.

## 2022-09-26 NOTE — Telephone Encounter (Signed)
Sent pt a Clinical cytogeneticist message with the information below, requested for her to let us know how she would like to proceed.

## 2022-09-27 ENCOUNTER — Encounter: Payer: Self-pay | Admitting: Internal Medicine

## 2022-09-27 ENCOUNTER — Other Ambulatory Visit (HOSPITAL_COMMUNITY): Payer: Self-pay

## 2022-09-28 ENCOUNTER — Other Ambulatory Visit (HOSPITAL_COMMUNITY): Payer: Self-pay

## 2022-09-28 NOTE — Telephone Encounter (Signed)
Patient notified via mychart

## 2022-09-28 NOTE — Telephone Encounter (Addendum)
UPDATE: Patient called back as I was writing this message and was able to provide all information stated below. Pt did ask if she should start on day that she receives since that will be two weeks since last infusion in clinic (09/20/22)    Returned called pt from message on my voicemail, but had to leave a message. Although Ms Anna Berger ended up being not eligible for the copay card, I was able to process BOTH insurances for her instead. [employer] as primary and split billed to her [employer]. [employer] picked up the remaining balance of $200 from [employer] making her copay now $0 per month as long plan allows. We [facility]) are ordering the medication and it should be in stock by Friday. We plan to set up shipment for the following week for a delivery of Tuesday 10/04/22 or latest Wednesday 10/05/22. Please let us know if those dates work best for you. If you have not received delivery by Wednesday please give [facility] a call to track order.

## 2022-09-28 NOTE — Telephone Encounter (Signed)
SInce pt is changing from [facility] IV to the [facility] her last IV dose was 09/20/22. She should wait 8 weeks to start the [place] dose on 11/15/22 and then do every 2 weeks, correct? Please advise.

## 2022-09-28 NOTE — Telephone Encounter (Signed)
Begin SQ Entyvio 108 mg 4 weeks after last infusion and then every 2 weeks

## 2022-09-30 ENCOUNTER — Other Ambulatory Visit (HOSPITAL_COMMUNITY): Payer: Self-pay

## 2022-09-30 NOTE — Telephone Encounter (Signed)
She will not need to start the medication for 4 weeks per Dr. Rhea Belton. Will [facility] be able to get it by then?

## 2022-09-30 NOTE — Telephone Encounter (Signed)
[facility] buyer is having difficulty ordering the Entyvio pen. Please send to other pharmacy ([facility] or [facility]) to prevent patient care delay.

## 2022-09-30 NOTE — Telephone Encounter (Signed)
Can't definitively say. We have been working with the buyer since 4/17 to get the medication in, and was expecting it in on today 09/30/22. However, we are still experiencing a delay in getting it in. Additionally, she is aware that there may be an increase in patient transitioning to the pen, so she is still pursuing getting the pens available with [facility].

## 2022-10-05 ENCOUNTER — Other Ambulatory Visit: Payer: Self-pay

## 2022-10-05 MED ORDER — ENTYVIO 108 MG/0.68ML ~~LOC~~ SOPN: 108.0000 mg | PEN_INJECTOR | SUBCUTANEOUS | 11 refills | Status: DC

## 2022-10-05 NOTE — Telephone Encounter (Signed)
Sent Rx to [facility] and pt is aware.

## 2022-10-06 ENCOUNTER — Other Ambulatory Visit (HOSPITAL_COMMUNITY): Payer: Self-pay

## 2022-10-14 ENCOUNTER — Other Ambulatory Visit: Payer: Self-pay | Admitting: Internal Medicine

## 2022-10-18 NOTE — Telephone Encounter (Signed)
Bonita Quin, Can you assist patient.  (Per your notes script was sent to [facility]). You may need to reach out to [facility] and set up delivery to her home address if it has not been done already. Thanks Selena Batten

## 2022-10-21 ENCOUNTER — Other Ambulatory Visit: Payer: Self-pay

## 2022-10-21 ENCOUNTER — Other Ambulatory Visit (HOSPITAL_COMMUNITY): Payer: Self-pay

## 2022-10-21 MED ORDER — ENTYVIO 108 MG/0.68ML ~~LOC~~ SOPN
108.0000 mg | PEN_INJECTOR | SUBCUTANEOUS | 11 refills | Status: DC
  Filled 2022-10-21: qty 1.36, fill #0
  Filled 2022-10-25 (×2): qty 1.36, 28d supply, fill #0
  Filled 2022-11-15: qty 1.36, 28d supply, fill #1

## 2022-10-24 ENCOUNTER — Other Ambulatory Visit (HOSPITAL_COMMUNITY): Payer: Self-pay

## 2022-10-25 ENCOUNTER — Other Ambulatory Visit (HOSPITAL_COMMUNITY): Payer: Self-pay

## 2022-10-25 ENCOUNTER — Other Ambulatory Visit: Payer: Self-pay

## 2022-10-25 NOTE — Telephone Encounter (Signed)
Patient has been updated. I will follow up with patient tomorrow to set up delivery.

## 2022-10-25 NOTE — Telephone Encounter (Signed)
Thanks Rachael, You're the best

## 2022-10-25 NOTE — Telephone Encounter (Signed)
Assistance needed.  @ Linda @[place] , Please co-ordinate delivery of her Entyvio SQ dosing.  (Pharmacy benefit)

## 2022-10-25 NOTE — Telephone Encounter (Signed)
Hello Team,  @Rachael  Can someone assist patient with co-ordination of her Entyvio SQ.

## 2022-10-26 ENCOUNTER — Other Ambulatory Visit: Payer: Self-pay

## 2022-10-26 ENCOUNTER — Other Ambulatory Visit (HOSPITAL_COMMUNITY): Payer: Self-pay

## 2022-10-26 NOTE — Telephone Encounter (Signed)
Thanks Monchell. @yatin , please d/c tx plan.  Thanks. Selena Batten

## 2022-10-26 NOTE — Telephone Encounter (Signed)
It has been canceled  

## 2022-10-26 NOTE — Telephone Encounter (Signed)
Follow up with patient again today. Medication has been shipped and patient should receive medication on tomorrow via [facility] [employer]. Requested for patient to check MyChart for updated shipping information. Patient voiced understanding and will call back or reach out to [facility] for further guidance.

## 2022-11-09 DIAGNOSIS — D84821 Immunodeficiency due to drugs: Secondary | ICD-10-CM | POA: Insufficient documentation

## 2022-11-14 DIAGNOSIS — M8589 Other specified disorders of bone density and structure, multiple sites: Secondary | ICD-10-CM | POA: Insufficient documentation

## 2022-11-15 ENCOUNTER — Other Ambulatory Visit (HOSPITAL_COMMUNITY): Payer: Self-pay

## 2022-11-21 ENCOUNTER — Encounter: Payer: Self-pay | Admitting: Internal Medicine

## 2022-11-22 ENCOUNTER — Ambulatory Visit: Payer: BC Managed Care – PPO

## 2022-11-22 ENCOUNTER — Other Ambulatory Visit (HOSPITAL_COMMUNITY): Payer: Self-pay

## 2022-12-01 ENCOUNTER — Other Ambulatory Visit: Payer: Self-pay | Admitting: Obstetrics and Gynecology

## 2022-12-05 NOTE — Progress Notes (Signed)
Diagnosis: Crohn's Disease  Provider:  Chilton Greathouse MD  Procedure: IV Infusion  IV Type: Peripheral, IV Location: R Antecubital  Entyvio (Vedolizumab), Dose: 300 mg  Infusion Start Time: 1522  Infusion Stop Time: 1556  Post Infusion IV Care: Observation period completed and Peripheral IV Discontinued  Discharge: Condition: Good, Destination: Home . AVS Provided  Performed by:  Marin Shutter, RN

## 2022-12-16 ENCOUNTER — Other Ambulatory Visit (HOSPITAL_COMMUNITY): Payer: Self-pay

## 2022-12-16 ENCOUNTER — Other Ambulatory Visit: Payer: Self-pay

## 2022-12-20 ENCOUNTER — Other Ambulatory Visit (HOSPITAL_COMMUNITY): Payer: Self-pay

## 2022-12-20 ENCOUNTER — Other Ambulatory Visit: Payer: Self-pay

## 2022-12-21 ENCOUNTER — Other Ambulatory Visit: Payer: Self-pay

## 2022-12-23 ENCOUNTER — Other Ambulatory Visit (HOSPITAL_COMMUNITY): Payer: Self-pay

## 2022-12-26 NOTE — Telephone Encounter (Signed)
Anna Berger, Patient has new insurance and will need a new auth submitted for Entyvio ([place]).  Thanks Selena Batten

## 2022-12-27 ENCOUNTER — Other Ambulatory Visit (HOSPITAL_COMMUNITY): Payer: Self-pay

## 2022-12-27 ENCOUNTER — Telehealth: Payer: Self-pay | Admitting: Pharmacy Technician

## 2022-12-27 NOTE — Telephone Encounter (Signed)
Pharmacy Patient Advocate Encounter   Received notification from Patient Advice Request messages that prior authorization for ENTYVIO 108MG  is required/requested.   Insurance verification completed.   The patient is insured through [facility] .   Per test claim: PA submitted to [facility] via CoverMyMeds Key/confirmation #/EOC [id number] Status is pending

## 2022-12-27 NOTE — Telephone Encounter (Signed)
Pharmacy Patient Advocate Encounter  Received notification from [facility] that Prior Authorization for ENTYVIO 108 MG has been APPROVED from 7.23.24 to 10.29.24. Ran test claim, Copay is $ .  PA #/Case ID/Reference #: [id number]

## 2022-12-27 NOTE — Telephone Encounter (Signed)
Thank you! PA has been submitted and approved. Routed message to [facility] nurse to send in new script to [facility].

## 2022-12-28 MED ORDER — ENTYVIO 108 MG/0.68ML ~~LOC~~ SOPN: 108.0000 mg | PEN_INJECTOR | SUBCUTANEOUS | 11 refills | Status: DC

## 2022-12-28 NOTE — Telephone Encounter (Signed)
Entyvio 108 mg prescription sent to [facility] specialty pharmacy. Patient notified via MyChart.

## 2023-01-10 ENCOUNTER — Ambulatory Visit (AMBULATORY_SURGERY_CENTER): Payer: PRIVATE HEALTH INSURANCE

## 2023-01-10 VITALS — Ht 63.0 in | Wt 140.0 lb

## 2023-01-10 DIAGNOSIS — K5100 Ulcerative (chronic) pancolitis without complications: Secondary | ICD-10-CM

## 2023-01-10 MED ORDER — SUTAB 1479-225-188 MG PO TABS
12.0000 | ORAL_TABLET | ORAL | 0 refills | Status: DC
Start: 2023-01-10 — End: 2023-01-31

## 2023-01-10 NOTE — Progress Notes (Signed)
No egg or soy allergy known to patient  No issues known to pt with past sedation with any surgeries or procedures Patient denies ever being told they had issues or difficulty with intubation  No FH of Malignant Hyperthermia Pt is not on diet pills Pt is not on  home 02  Pt is not on blood thinners  Pt denies issues with constipation  No A fib or A flutter Have any cardiac testing pending--no  LOA: independent  Prep: Sutab   Patient's chart reviewed by Cathlyn Parsons CNRA prior to previsit and patient appropriate for the [facility].  Previsit completed and red dot placed by patient's name on their procedure day (on provider's schedule).     PV competed with patient. Prep instructions sent via mychart and home address. SUTAB coupon  provided to use for price reduction if needed.

## 2023-01-31 ENCOUNTER — Encounter: Payer: Self-pay | Admitting: Internal Medicine

## 2023-01-31 ENCOUNTER — Ambulatory Visit: Payer: PRIVATE HEALTH INSURANCE | Admitting: Internal Medicine

## 2023-01-31 VITALS — BP 117/71 | HR 64 | Temp 97.8°F | Resp 15 | Ht 63.0 in | Wt 140.0 lb

## 2023-01-31 DIAGNOSIS — K5100 Ulcerative (chronic) pancolitis without complications: Secondary | ICD-10-CM

## 2023-01-31 DIAGNOSIS — D123 Benign neoplasm of transverse colon: Secondary | ICD-10-CM

## 2023-01-31 MED ORDER — SODIUM CHLORIDE 0.9 % IV SOLN: 500.0000 mL | Freq: Once | INTRAVENOUS | Status: DC

## 2023-01-31 NOTE — Patient Instructions (Addendum)
-   Resume previous diet. - Continue present medications including Entyvio SQ every 2 weeks. - Await pathology results. - Repeat colonoscopy is recommended for surveillance. The colonoscopy date will be determined after pathology results from today's exam become available for review.   YOU HAD AN ENDOSCOPIC PROCEDURE TODAY AT THE [facility]:   Refer to the procedure report that was given to you for any specific questions about what was found during the examination.  If the procedure report does not answer your questions, please call your gastroenterologist to clarify.  If you requested that your care partner not be given the details of your procedure findings, then the procedure report has been included in a sealed envelope for you to review at your convenience later.  YOU SHOULD EXPECT: Some feelings of bloating in the abdomen. Passage of more gas than usual.  Walking can help get rid of the air that was put into your GI tract during the procedure and reduce the bloating. If you had a lower endoscopy (such as a colonoscopy or flexible sigmoidoscopy) you may notice spotting of blood in your stool or on the toilet paper. If you underwent a bowel prep for your procedure, you may not have a normal bowel movement for a few days.  Please Note:  You might notice some irritation and congestion in your nose or some drainage.  This is from the oxygen used during your procedure.  There is no need for concern and it should clear up in a day or so.  SYMPTOMS TO REPORT IMMEDIATELY:  Following lower endoscopy (colonoscopy or flexible sigmoidoscopy):  Excessive amounts of blood in the stool  Significant tenderness or worsening of abdominal pains  Swelling of the abdomen that is new, acute  Fever of 100F or higher  For urgent or emergent issues, a gastroenterologist can be reached at any hour by calling [phone]. Do not use MyChart messaging for urgent concerns.    DIET:  We do recommend a  small meal at first, but then you may proceed to your regular diet.  Drink plenty of fluids but you should avoid alcoholic beverages for 24 hours.  ACTIVITY:  You should plan to take it easy for the rest of today and you should NOT DRIVE or use heavy machinery until tomorrow (because of the sedation medicines used during the test).    FOLLOW UP: Our staff will call the number listed on your records the next business day following your procedure.  We will call around 7:15- 8:00 am to check on you and address any questions or concerns that you may have regarding the information given to you following your procedure. If we do not reach you, we will leave a message.     If any biopsies were taken you will be contacted by phone or by letter within the next 1-3 weeks.  Please call us at [phone] if you have not heard about the biopsies in 3 weeks.    SIGNATURES/CONFIDENTIALITY: You and/or your care partner have signed paperwork which will be entered into your electronic medical record.  These signatures attest to the fact that that the information above on your After Visit Summary has been reviewed and is understood.  Full responsibility of the confidentiality of this discharge information lies with you and/or your care-partner.

## 2023-01-31 NOTE — Progress Notes (Signed)
To pacu, VSS. Report to [email or web address].tb 

## 2023-01-31 NOTE — Op Note (Signed)
[facility] Patient Name: Anna Berger Procedure Date: 01/31/2023 9:44 AM MRN: [id number] Endoscopist: Beverley Fiedler , MD, [id number] Age: 47 Referring MD:  Date of Birth: September 25, 1975 Gender: Female Account #: [id number] Procedure:                Colonoscopy Indications:              Last colonoscopy: August 2022 (Mayo score 1 at that                            exam), Chronic ulcerative pancolitis; Entyvio                            started Jan 2024; hx of SSP (2020) Medicines:                Monitored Anesthesia Care Procedure:                Pre-Anesthesia Assessment:                           - Prior to the procedure, a History and Physical                            was performed, and patient medications and                            allergies were reviewed. The patient's tolerance of                            previous anesthesia was also reviewed. The risks                            and benefits of the procedure and the sedation                            options and risks were discussed with the patient.                            All questions were answered, and informed consent                            was obtained. Prior Anticoagulants: The patient has                            taken no anticoagulant or antiplatelet agents. ASA                            Grade Assessment: II - A patient with mild systemic                            disease. After reviewing the risks and benefits,                            the patient was deemed in satisfactory condition to  undergo the procedure.                           After obtaining informed consent, the colonoscope                            was passed under direct vision. Throughout the                            procedure, the patient's blood pressure, pulse, and                            oxygen saturations were monitored continuously. The                            Olympus Scope SN: [id number]  was introduced through                            the anus and advanced to the terminal ileum. The                            colonoscopy was performed without difficulty. The                            patient tolerated the procedure well. The quality                            of the bowel preparation was good. The terminal                            ileum, ileocecal valve, appendiceal orifice, and                            rectum were photographed. Scope In: 9:54:59 AM Scope Out: 10:06:18 AM Scope Withdrawal Time: 0 hours 8 minutes 54 seconds  Total Procedure Duration: 0 hours 11 minutes 19 seconds  Findings:                 The digital rectal exam was normal.                           The terminal ileum appeared normal.                           Inflammation was not found based on the endoscopic                            appearance of the mucosa in the colon. This was                            graded as Mayo Score 0 (normal or inactive                            disease), and when compared to the previous  examination, the findings are improved. Four                            biopsies were taken every 10 cm with a cold forceps                            from the entire colon for dysplasia surveillance                            and ulcerative colitis surveillance. These biopsy                            specimens from the right colon and left colon were                            sent to Pathology.                           A 4 mm polyp was found in the hepatic flexure. The                            polyp was sessile. The polyp was removed with a                            cold biopsy forceps. Resection and retrieval were                            complete.                           The retroflexed view of the distal rectum and anal                            verge was normal and showed no anal or rectal                             abnormalities. Complications:            No immediate complications. Estimated Blood Loss:     Estimated blood loss was minimal. Impression:               - The examined portion of the ileum was normal.                           - Inactive (Mayo Score 0) ulcerative colitis, in                            remission, improved since the last examination.                            Biopsied.                           - One 4 mm polyp at the hepatic flexure, removed  with a cold biopsy forceps. Resected and retrieved.                           - The distal rectum and anal verge are normal on                            retroflexion view. Recommendation:           - Patient has a contact number available for                            emergencies. The signs and symptoms of potential                            delayed complications were discussed with the                            patient. Return to normal activities tomorrow.                            Written discharge instructions were provided to the                            patient.                           - Resume previous diet.                           - Continue present medications including Entyvio SQ                            every 2 weeks.                           - Await pathology results.                           - Repeat colonoscopy is recommended for                            surveillance. The colonoscopy date will be                            determined after pathology results from today's                            exam become available for review. Beverley Fiedler, MD 01/31/2023 10:11:11 AM This report has been signed electronically.

## 2023-01-31 NOTE — Progress Notes (Signed)
GASTROENTEROLOGY PROCEDURE H&P NOTE   Primary Care Physician: Teena Irani, PA-C    Reason for Procedure:  Ulcerative colitis surveillance  Plan:    Colonoscopy  Patient is appropriate for endoscopic procedure(s) in the ambulatory ([facility]) setting.  The nature of the procedure, as well as the risks, benefits, and alternatives were carefully and thoroughly reviewed with the patient. Ample time for discussion and questions allowed. The patient understood, was satisfied, and agreed to proceed.     HPI: Anna Berger is a 47 y.o. female who presents for colonoscopy.  Medical history as below.  Tolerated the prep.  No recent chest pain or shortness of breath.  No abdominal pain today.  Past Medical History:  Diagnosis Date   Allergy    Blood dyscrasia    Factor V mutation   Clotting disorder (HCC)    Endometritis    Factor V deficiency (HCC)    Family history of coronary artery disease    Family history of malignant neoplasm of gastrointestinal tract    GERD (gastroesophageal reflux disease)    Hypercholesteremia    Hyperlipemia    Hyperlipidemia    Insomnia, unspecified    Iron deficiency anemia    Rapid palpitations    UC (ulcerative colitis) (HCC)    Unspecified vitamin D deficiency    Vitamin B12 deficiency     Past Surgical History:  Procedure Laterality Date   CESAREAN SECTION  02/2006   CESAREAN SECTION N/A 02/17/2014   Procedure: REPEAT CESAREAN SECTION;  Surgeon: Purcell Nails, MD;  Location: [facility];  Service: Obstetrics;  Laterality: N/A;   COLONOSCOPY     OVARIAN CYST REMOVAL  2012   POLYPECTOMY     SCAR REVISION N/A 02/17/2014   Procedure: SCAR REVISION;  Surgeon: Purcell Nails, MD;  Location: [facility];  Service: Obstetrics;  Laterality: N/A;    Prior to Admission medications   Medication Sig Start Date End Date Taking? Authorizing Provider  BIOTIN PO Take 1 tablet by mouth daily.    Yes [provider]  esomeprazole (NEXIUM) 20 MG  capsule Take 20 mg by mouth daily at 12 noon.   Yes [provider]  magnesium oxide (MAG-OX) 400 MG tablet Take 400 mg by mouth daily.   Yes [provider]  QUVIVIQ 25 MG TABS Take 25 mg by mouth at bedtime.   Yes [provider]  rosuvastatin (CRESTOR) 20 MG tablet Take 20 mg by mouth at bedtime.   Yes [provider]  Vedolizumab (ENTYVIO) 108 MG/0.68ML SOPN Inject 108 mg into the skin every 14 (fourteen) days. 12/28/22  Yes Mikie Misner, Carie Caddy, MD  celecoxib (CELEBREX) 200 MG capsule Take 1 capsule by mouth daily. Patient not taking: Reported on 01/10/2023 11/09/22   [provider]  cyanocobalamin (VITAMIN B12) 1000 MCG/ML injection INJECT 1 ML INTO THE MUSCLE EVERY 30 DAYS 10/14/22   Taran Haynesworth, Carie Caddy, MD  mesalamine (APRISO) 0.375 g 24 hr capsule TAKE 4 CAPSULES (1.5 G TOTAL) BY MOUTH DAILY. Patient not taking: Reported on 01/10/2023 03/01/21   Zehr, Princella Pellegrini, PA-C  traZODone (DESYREL) 100 MG tablet Take 100 mg by mouth at bedtime. 05/05/22   [provider]    Current Outpatient Medications  Medication Sig Dispense Refill   BIOTIN PO Take 1 tablet by mouth daily.      esomeprazole (NEXIUM) 20 MG capsule Take 20 mg by mouth daily at 12 noon.     magnesium oxide (MAG-OX) 400  MG tablet Take 400 mg by mouth daily.     QUVIVIQ 25 MG TABS Take 25 mg by mouth at bedtime.     rosuvastatin (CRESTOR) 20 MG tablet Take 20 mg by mouth at bedtime.     Vedolizumab (ENTYVIO) 108 MG/0.68ML SOPN Inject 108 mg into the skin every 14 (fourteen) days. 1.36 mL 11   celecoxib (CELEBREX) 200 MG capsule Take 1 capsule by mouth daily. (Patient not taking: Reported on 01/10/2023)     cyanocobalamin (VITAMIN B12) 1000 MCG/ML injection INJECT 1 ML INTO THE MUSCLE EVERY 30 DAYS 3 mL 3   mesalamine (APRISO) 0.375 g 24 hr capsule TAKE 4 CAPSULES (1.5 G TOTAL) BY MOUTH DAILY. (Patient not taking: Reported on 01/10/2023) 360 capsule 3   traZODone (DESYREL) 100 MG tablet Take 100 mg by  mouth at bedtime.     Current Facility-Administered Medications  Medication Dose Route Frequency Provider Last Rate Last Admin   0.9 %  sodium chloride infusion  500 mL Intravenous Once Agnieszka Newhouse, Carie Caddy, MD        Allergies as of 01/31/2023   (No Known Allergies)    Family History  Problem Relation Age of Onset   Hypertension Father    Coronary artery disease Father    Heart attack Father 34   Hyperlipidemia Father    Hyperlipidemia Brother    Crohn's disease Maternal Uncle    Colon cancer Maternal Uncle    Colon polyps Paternal Aunt    Diabetes Maternal Uncle    Ulcerative colitis Sister    Esophageal cancer Neg Hx    Rectal cancer Neg Hx    Stomach cancer Neg Hx     Social History   Socioeconomic History   Marital status: Married    Spouse name: Not on file   Number of children: 2   Years of education: Not on file   Highest education level: Not on file  Occupational History   Occupation: [employer]     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  Tobacco Use   Smoking status: Never   Smokeless tobacco: Never  Vaping Use   Vaping status: Never Used  Substance and Sexual Activity   Alcohol use: Yes    Alcohol/week: 0.0 standard drinks of alcohol    Comment: social   Drug use: No   Sexual activity: Yes  Other Topics Concern   Not on file  Social History Narrative   Not on file   Social Determinants of Health   Financial Resource Strain: Low Risk  (11/09/2022)   Received from [facility], [facility]   Overall Financial Resource Strain (CARDIA)    Difficulty of Paying Living Expenses: Not hard at all  Food Insecurity: No Food Insecurity (11/09/2022)   Received from [facility], [facility]   Hunger Vital Sign    Worried About Running Out of Food in the Last Year: Never true    Ran Out of Food in the Last Year: Never true  Transportation Needs: No Transportation Needs (11/09/2022)   Received from [facility], [facility]   PRAPARE - Transportation    Lack of Transportation  (Medical): No    Lack of Transportation (Non-Medical): No  Physical Activity: Unknown (11/09/2022)   Received from [facility], [facility]   Exercise Vital Sign    Days of Exercise per Week: 0 days    Minutes of Exercise per Session: Not on file  Stress: No Stress Concern Present (11/09/2022)   Received from [facility], [facility]  [employer] of Occupational Health - Occupational Stress Questionnaire    Feeling of Stress : Not at all  Social Connections: Moderately Integrated (11/09/2022)   Received from [facility], [facility]   Social Network    How would you rate your social network (family, work, friends)?: Adequate participation with social networks  Intimate Partner Violence: Not At Risk (11/09/2022)   Received from [facility], [facility]   HITS    Over the last 12 months how often did your partner physically hurt you?: 1    Over the last 12 months how often did your partner insult you or talk down to you?: 1    Over the last 12 months how often did your partner threaten you with physical harm?: 1    Over the last 12 months how often did your partner scream or curse at you?: 1    Physical Exam: Vital signs in last 24 hours: @BP  124/79   Pulse 64   Temp 97.8 F (36.6 C)   Ht 5\' 3"  (1.6 m)   Wt 140 lb (63.5 kg)   SpO2 98%   BMI 24.80 kg/m  GEN: NAD EYE: Sclerae anicteric ENT: MMM CV: Non-tachycardic Pulm: CTA b/l GI: Soft, NT/ND NEURO:  Alert & Oriented x 3   Erick Blinks, MD [facility]  01/31/2023 9:47 AM

## 2023-01-31 NOTE — Progress Notes (Signed)
Called to room to assist during endoscopic procedure.  Patient ID and intended procedure confirmed with present staff. Received instructions for my participation in the procedure from the performing physician.  

## 2023-01-31 NOTE — Progress Notes (Signed)
VS by KD  Pt's states no medical or surgical changes since previsit or office visit.

## 2023-02-01 ENCOUNTER — Telehealth: Payer: Self-pay | Admitting: *Deleted

## 2023-02-01 NOTE — Telephone Encounter (Signed)
  Follow up Call-     01/31/2023    8:56 AM 01/28/2021    9:01 AM  Call back number  Post procedure Call Back phone  # [phone]  Permission to leave phone message Yes Yes     Patient questions:  Message left to call us if necessary.

## 2023-02-09 ENCOUNTER — Encounter: Payer: Self-pay | Admitting: Internal Medicine

## 2023-02-10 ENCOUNTER — Other Ambulatory Visit (HOSPITAL_COMMUNITY): Payer: Self-pay

## 2023-02-24 ENCOUNTER — Other Ambulatory Visit: Payer: Self-pay

## 2023-04-18 ENCOUNTER — Telehealth: Payer: Self-pay | Admitting: Internal Medicine

## 2023-04-18 NOTE — Telephone Encounter (Signed)
[facility] is calling to have an authorization sent in for [facility]. Please advise.

## 2023-04-19 ENCOUNTER — Other Ambulatory Visit (HOSPITAL_COMMUNITY): Payer: Self-pay

## 2023-04-24 ENCOUNTER — Encounter: Payer: Self-pay | Admitting: Internal Medicine

## 2023-04-25 NOTE — Telephone Encounter (Signed)
Inbound call from [facility] specialty pharmacy stating patient is due for Entyvio injection. Requesting an update regarding prior auth. Please advise, thank you.

## 2023-05-01 NOTE — Telephone Encounter (Signed)
[facility] rep called and stated that they have been trying to get [facility] prior authorization authorized so that they are able to refill the patient prescription. Please advise.

## 2023-05-03 ENCOUNTER — Other Ambulatory Visit: Payer: Self-pay

## 2023-05-08 NOTE — Telephone Encounter (Signed)
[facility] Rep calling to follow up on prior authorization below. States patient is requesting refill.

## 2023-05-09 ENCOUNTER — Telehealth: Payer: Self-pay | Admitting: Pharmacy Technician

## 2023-05-09 NOTE — Telephone Encounter (Signed)
Monchell,  Per patient message, she is needing a new auth for her Entyvio SQ.(Self admin)  Please submit auth if you have not done so already.  Appreciate your help. Selena Batten

## 2023-05-09 NOTE — Telephone Encounter (Signed)
Patient calling to f/u on prior auth. Please advise.

## 2023-05-11 ENCOUNTER — Telehealth: Payer: Self-pay | Admitting: Pharmacy Technician

## 2023-05-11 ENCOUNTER — Other Ambulatory Visit (HOSPITAL_COMMUNITY): Payer: Self-pay

## 2023-05-11 NOTE — Telephone Encounter (Signed)
Received a call from [facility] regarding PA for pts [place] entyvio. State they sent a request for PA on 101/12 and have not heard anything yet. The contact at the pharmacy is [place] and the phone number is [phone].  Arminda Resides also sent the request, see note below.

## 2023-05-11 NOTE — Telephone Encounter (Signed)
See note below:  Amalia Greenhouse, CPhT      05/09/23 10:20 AM Note Monchell,   Per patient message, she is needing a new auth for her Entyvio SQ.(Self admin)   Please submit auth if you have not done so already.   Appreciate your help. Selena Batten

## 2023-05-11 NOTE — Telephone Encounter (Signed)
Pharmacy Patient Advocate Encounter  Received notification from [facility] that Prior Authorization for ENTYVIO 108MG  has been APPROVED from 12.5.24 to 12.5.25   PA #/Case ID/Reference #: [id number]

## 2023-05-11 NOTE — Telephone Encounter (Signed)
Pharmacy Patient Advocate Encounter   Received notification from Physician's Office that prior authorization for ENTYVIO 108MG  is required/requested.   Insurance verification completed.   The patient is insured through [facility] .   Per test claim: PA required; PA submitted to above mentioned insurance via CoverMyMeds Key/confirmation #/EOC [id number] Status is pending

## 2023-08-29 ENCOUNTER — Encounter: Payer: Self-pay | Admitting: Podiatry

## 2023-08-29 ENCOUNTER — Ambulatory Visit (INDEPENDENT_AMBULATORY_CARE_PROVIDER_SITE_OTHER): Payer: PRIVATE HEALTH INSURANCE | Admitting: Podiatry

## 2023-08-29 DIAGNOSIS — L600 Ingrowing nail: Secondary | ICD-10-CM

## 2023-08-29 DIAGNOSIS — Z3490 Encounter for supervision of normal pregnancy, unspecified, unspecified trimester: Secondary | ICD-10-CM | POA: Insufficient documentation

## 2023-08-29 DIAGNOSIS — D682 Hereditary deficiency of other clotting factors: Secondary | ICD-10-CM | POA: Insufficient documentation

## 2023-08-29 MED ORDER — NEOMYCIN-POLYMYXIN-HC 1 % OT SOLN: OTIC | 1 refills | Status: DC

## 2023-08-29 NOTE — Patient Instructions (Signed)

## 2023-08-30 NOTE — Progress Notes (Signed)
 Subjective:  Patient ID: Anna Berger, female    DOB: [birth year],  MRN: [id number] HPI Chief Complaint  Patient presents with   Toe Pain    Hallux bilateral - both borders, left hallux lateral border is the worst, been infected for 1-2 weeks after having a pedicure, concerned about nail fungus as well   New Patient (Initial Visit)    48 y.o. female presents with the above complaint.   ROS: Denies fever chills nausea vomiting muscle aches pains calf pain back pain chest pain shortness of breath.  Past Medical History:  Diagnosis Date   Allergy    Blood dyscrasia    Factor V mutation   Clotting disorder (HCC)    Endometritis    Factor V deficiency (HCC)    Family history of coronary artery disease    Family history of malignant neoplasm of gastrointestinal tract    GERD (gastroesophageal reflux disease)    Hypercholesteremia    Hyperlipemia    Hyperlipidemia    Insomnia, unspecified    Iron deficiency anemia    Rapid palpitations    UC (ulcerative colitis) (HCC)    Unspecified vitamin D deficiency    Vitamin B12 deficiency    Past Surgical History:  Procedure Laterality Date   CESAREAN SECTION  02/2006   CESAREAN SECTION N/A 02/17/2014   Procedure: REPEAT CESAREAN SECTION;  Surgeon: Purcell Nails, MD;  Location: [facility];  Service: Obstetrics;  Laterality: N/A;   COLONOSCOPY     OVARIAN CYST REMOVAL  2012   POLYPECTOMY     SCAR REVISION N/A 02/17/2014   Procedure: SCAR REVISION;  Surgeon: Purcell Nails, MD;  Location: [facility];  Service: Obstetrics;  Laterality: N/A;    Current Outpatient Medications:    NEOMYCIN-POLYMYXIN-HYDROCORTISONE (CORTISPORIN) 1 % SOLN OTIC solution, Apply 1-2 drops to toe BID after soaking, Disp: 10 mL, Rfl: 1   zolpidem (AMBIEN CR) 12.5 MG CR tablet, Take by mouth., Disp: , Rfl:    BIOTIN PO, Take 1 tablet by mouth daily. , Disp: , Rfl:    cyanocobalamin (VITAMIN B12) 1000 MCG/ML injection, INJECT 1 ML INTO THE MUSCLE EVERY 30 DAYS, Disp: 3  mL, Rfl: 3   esomeprazole (NEXIUM) 20 MG capsule, Take 20 mg by mouth daily at 12 noon., Disp: , Rfl:    magnesium oxide (MAG-OX) 400 MG tablet, Take 400 mg by mouth daily., Disp: , Rfl:    QUVIVIQ 25 MG TABS, Take 25 mg by mouth at bedtime., Disp: , Rfl:    rosuvastatin (CRESTOR) 20 MG tablet, Take 20 mg by mouth at bedtime., Disp: , Rfl:    traZODone (DESYREL) 100 MG tablet, Take 100 mg by mouth at bedtime., Disp: , Rfl:    Vedolizumab (ENTYVIO) 108 MG/0.68ML SOPN, Inject 108 mg into the skin every 14 (fourteen) days., Disp: 1.36 mL, Rfl: 11  No Known Allergies Review of Systems Objective:  There were no vitals filed for this visit.  General: Well developed, nourished, in no acute distress, alert and oriented x3   Dermatological: Skin is warm, dry and supple bilateral. Nails x 10 are well maintained; remaining integument appears unremarkable at this time. There are no open sores, no preulcerative lesions, no rash or signs of infection present.  Onychocryptosis of the hallux nails bilaterally with a sharp incurvated nail margin along the fibular border of the hallux left resulting in a paronychia.  No granulation tissue no purulence is noted.  Tibial borders of the hallux left and both  borders of the hallux right are sharply ingrown and painful as well but do not demonstrate any signs of infection.  Vascular: Dorsalis Pedis artery and Posterior Tibial artery pedal pulses are 2/4 bilateral with immedate capillary fill time. Pedal hair growth present. No varicosities and no lower extremity edema present bilateral.   Neruologic: Grossly intact via light touch bilateral. Vibratory intact via tuning fork bilateral. Protective threshold with Semmes Wienstein monofilament intact to all pedal sites bilateral. Patellar and Achilles deep tendon reflexes 2+ bilateral. No Babinski or clonus noted bilateral.   Musculoskeletal: No gross boney pedal deformities bilateral. No pain, crepitus, or limitation  noted with foot and ankle range of motion bilateral. Muscular strength 5/5 in all groups tested bilateral.  Gait: Unassisted, Nonantalgic.    Radiographs:  None taken  Assessment & Plan:   Assessment: Ingrown toenail tibial-fibular border the hallux bilateral  Plan: Chemical matrixectomy was performed today to the hallux bilateral after local anesthetic was administered.  Tolerated procedure well without complications.  Was given both oral and written home-going instructions for care and soaking of the toes until healed.  Also received a prescription for Cortisporin Otic to be applied twice daily after soaking.  I will follow-up with her in 2 weeks.     Anna Berger T. [place], [place]

## 2023-09-27 ENCOUNTER — Ambulatory Visit: Admitting: Podiatry

## 2023-11-24 ENCOUNTER — Encounter: Payer: Self-pay | Admitting: Internal Medicine

## 2023-11-24 NOTE — Telephone Encounter (Signed)
 yes

## 2023-12-26 ENCOUNTER — Other Ambulatory Visit: Payer: Self-pay | Admitting: Internal Medicine

## 2024-01-16 ENCOUNTER — Other Ambulatory Visit: Payer: Self-pay | Admitting: Internal Medicine

## 2024-01-16 DIAGNOSIS — K51919 Ulcerative colitis, unspecified with unspecified complications: Secondary | ICD-10-CM

## 2024-02-07 ENCOUNTER — Ambulatory Visit: Payer: PRIVATE HEALTH INSURANCE | Admitting: Physical Therapy

## 2024-02-15 ENCOUNTER — Other Ambulatory Visit: Payer: Self-pay | Admitting: Internal Medicine

## 2024-02-15 DIAGNOSIS — K51919 Ulcerative colitis, unspecified with unspecified complications: Secondary | ICD-10-CM

## 2024-03-15 ENCOUNTER — Other Ambulatory Visit: Payer: Self-pay | Admitting: Internal Medicine

## 2024-03-15 DIAGNOSIS — K51919 Ulcerative colitis, unspecified with unspecified complications: Secondary | ICD-10-CM

## 2024-03-20 ENCOUNTER — Other Ambulatory Visit: Payer: Self-pay

## 2024-03-20 ENCOUNTER — Telehealth: Payer: Self-pay | Admitting: Internal Medicine

## 2024-03-20 DIAGNOSIS — K51919 Ulcerative colitis, unspecified with unspecified complications: Secondary | ICD-10-CM

## 2024-03-20 MED ORDER — ENTYVIO PEN 108 MG/0.68ML ~~LOC~~ SOAJ: 108.0000 mg | SUBCUTANEOUS | 3 refills | Status: DC

## 2024-03-20 NOTE — Telephone Encounter (Signed)
 Inbound call from patient stating she needed an appointment. Patient has been scheduled 12/3 at 3:00 at [facility]. Patient is requesting a refill for Entyvio . Please advise.

## 2024-03-20 NOTE — Telephone Encounter (Signed)
 Pt has ov scheduled in December, refill sent in for entyvio 

## 2024-04-12 ENCOUNTER — Other Ambulatory Visit (HOSPITAL_COMMUNITY): Payer: Self-pay

## 2024-04-12 ENCOUNTER — Telehealth: Payer: Self-pay

## 2024-04-12 NOTE — Telephone Encounter (Signed)
 Pharmacy Patient Advocate Encounter   Received notification from CoverMyMeds that prior authorization for ENTYVIO  PEN 108 MG/0.68ML SOAJ is required/requested.   Insurance verification completed.   The patient is insured through [employer].   Per test claim: Patient has not been seen since 2023 and updated chart notes are required for submission. Noted patient has appointment 05-07-2024

## 2024-05-06 NOTE — Progress Notes (Unsigned)
 Anna Console, PA-C [address] [place], [place]  [address] Phone: [phone]   Primary Care Physician: Jacques Camie Pepper, PA-C  Primary Gastroenterologist:  Anna Console, PA-C / Dr. Gordy Starch   Chief Complaint: Medication refill; follow-up ulcerative colitis and GERD       HPI:   Discussed the use of AI scribe software for clinical note transcription with the patient, who gave verbal consent to proceed.  Patient has history of chronic ulcerative pan colitis, currently in remission on Entyvio  SQ every 2 weeks.  Entyvio  started Jan 2024.  Ulcerative colitis originally diagnosed at age 48.  GERD is controlled on Nexium 20 Mg once daily.  Also has history of B12 and vitamin D  deficiency, and factor V Leiden.  01/2023 last colonoscopy: (Mayo Score 0), No inflammation in the colon.  One small 4mm tubular adenoma polyp removed.  Colon biopsies negative for inflammation or dysplasia.  3-year repeat colonoscopy will be due 01/2026.  04/2022 last follow-up visit with Dr. Starch.  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  Current Outpatient Medications  Medication Sig Dispense Refill   BIOTIN PO Take 1 tablet by mouth daily.      cyanocobalamin  (VITAMIN B12) 1000 MCG/ML injection Inject 1mL into the muscle every 30 days 3 mL 3   esomeprazole (NEXIUM) 20 MG capsule Take 20 mg by mouth daily at 12 noon.     magnesium oxide (MAG-OX) 400 MG tablet Take 400 mg by mouth daily.     NEOMYCIN -POLYMYXIN-HYDROCORTISONE (CORTISPORIN) 1 % SOLN OTIC solution Apply 1-2 drops to toe BID after soaking 10 mL 1   QUVIVIQ 25 MG TABS Take 25 mg by mouth at bedtime.     rosuvastatin (CRESTOR) 20 MG tablet Take 20 mg by mouth at bedtime.     traZODone (DESYREL) 100 MG tablet Take 100 mg by mouth at bedtime.     Vedolizumab  (ENTYVIO  PEN)  108 MG/0.68ML SOAJ Inject 108 mg into the skin every 14 (fourteen) days. 1.36 mL 3   zolpidem  (AMBIEN  CR) 12.5 MG CR tablet Take by mouth.     No current facility-administered medications for this visit.    Allergies as of 05/07/2024   (No Known Allergies)    Past Medical History:  Diagnosis Date   Allergy    Blood dyscrasia    Factor V mutation   Clotting disorder    Endometritis    Factor V deficiency (HCC)    Family history of coronary artery disease    Family history of malignant neoplasm of gastrointestinal tract    GERD (gastroesophageal reflux disease)    Hypercholesteremia    Hyperlipemia    Hyperlipidemia    Insomnia, unspecified    Iron deficiency anemia    Rapid palpitations    UC (ulcerative colitis) (HCC)    Unspecified vitamin D  deficiency    Vitamin B12 deficiency     Past Surgical History:  Procedure Laterality Date   CESAREAN SECTION  02/2006   CESAREAN SECTION N/A 02/17/2014   Procedure: REPEAT CESAREAN SECTION;  Surgeon: Jon CINDERELLA Rummer, MD;  Location: [facility];  Service: Obstetrics;  Laterality: N/A;   COLONOSCOPY     OVARIAN CYST REMOVAL  2012   POLYPECTOMY     SCAR REVISION N/A 02/17/2014   Procedure: SCAR REVISION;  Surgeon:  Jon CINDERELLA Rummer, MD;  Location: [facility];  Service: Obstetrics;  Laterality: N/A;    Review of Systems:    All systems reviewed and negative except where noted in HPI.    Physical Exam:  There were no vitals taken for this visit. No LMP recorded.  General: Well-nourished, well-developed in no acute distress.  Lungs: Clear to auscultation bilaterally. Non-labored. Heart: Regular rate and rhythm, no murmurs rubs or gallops.  Abdomen: Bowel sounds are normal; Abdomen is Soft; No hepatosplenomegaly, masses or hernias;  No Abdominal Tenderness; No guarding or rebound tenderness. Neuro: Alert and oriented x 3.  Grossly intact.  Psych: Alert and cooperative, normal mood and affect.   Imaging Studies: No results  found.  Labs: CBC    Component Value Date/Time   WBC 7.5 04/17/2018 1010   RBC 4.28 04/17/2018 1010   HGB 13.7 04/17/2018 1010   HCT 41.2 04/17/2018 1010   PLT 306.0 04/17/2018 1010   MCV 96.5 04/17/2018 1010   MCH 32.6 02/18/2014 0601   MCHC 33.2 04/17/2018 1010   RDW 14.1 04/17/2018 1010   LYMPHSABS 1.7 04/17/2018 1010   MONOABS 0.5 04/17/2018 1010   EOSABS 0.3 04/17/2018 1010   BASOSABS 0.1 04/17/2018 1010    CMP     Component Value Date/Time   NA 137 04/17/2018 1010   K 4.5 04/17/2018 1010   CL 103 04/17/2018 1010   CO2 26 04/17/2018 1010   GLUCOSE 91 04/17/2018 1010   BUN 20 04/17/2018 1010   CREATININE 0.68 04/17/2018 1010   CALCIUM 9.5 04/17/2018 1010   PROT 7.6 04/17/2018 1010   ALBUMIN 4.4 04/17/2018 1010   AST 24 04/17/2018 1010   ALT 16 04/17/2018 1010   ALKPHOS 51 04/17/2018 1010   BILITOT 0.5 04/17/2018 1010   GFRNONAA >90 02/17/2014 1955   GFRAA >90 02/17/2014 1955       Assessment and Plan:   Yolandra Habig Vickroy is a 48 y.o. y/o female   1.  Chronic ulcerative pancolitis originally diagnosed at age 48.  In remission on Entyvio  (started January 2024).  Last colonoscopy 01/2023 showed Mayo score 0 with no inflammation on biopsy.  No dysplasia. - Continue Entyvio  SQ every 2 weeks - Labs: CBC, CMP, CRP, QuantiFERON-TB gold - Fecal calprotectin - 3-year repeat colonoscopy will be due 01/2026.  2.  GERD controlled on low-dose PPI - Continue Nexium 20 mg daily  3.  History of vitamin B12 and vitamin D  deficiencies - Labs vitamin D  and vitamin B12  Assessment and Plan Assessment & Plan       Anna Console, PA-C  Follow up ***

## 2024-05-07 ENCOUNTER — Other Ambulatory Visit

## 2024-05-07 ENCOUNTER — Encounter: Payer: Self-pay | Admitting: Physician Assistant

## 2024-05-07 ENCOUNTER — Ambulatory Visit: Payer: PRIVATE HEALTH INSURANCE | Admitting: Physician Assistant

## 2024-05-07 VITALS — BP 110/68 | HR 91 | Ht 63.0 in | Wt 134.0 lb

## 2024-05-07 DIAGNOSIS — K5100 Ulcerative (chronic) pancolitis without complications: Secondary | ICD-10-CM

## 2024-05-07 DIAGNOSIS — K5904 Chronic idiopathic constipation: Secondary | ICD-10-CM | POA: Diagnosis not present

## 2024-05-07 DIAGNOSIS — K219 Gastro-esophageal reflux disease without esophagitis: Secondary | ICD-10-CM

## 2024-05-07 MED ORDER — ESOMEPRAZOLE MAGNESIUM 20 MG PO CPDR: 20.0000 mg | DELAYED_RELEASE_CAPSULE | Freq: Every day | ORAL | 3 refills | Status: AC

## 2024-05-07 MED ORDER — ENTYVIO PEN 108 MG/0.68ML ~~LOC~~ SOAJ: 108.0000 mg | SUBCUTANEOUS | 11 refills | Status: AC

## 2024-05-07 NOTE — Patient Instructions (Signed)
 Your provider has requested that you go to the basement level for lab work before leaving today. Press B on the elevator. The lab is located at the first door on the left as you exit the elevator.  Please follow up sooner if symptoms increase or worsen  Due to recent changes in healthcare laws, you may see the results of your imaging and laboratory studies on MyChart before your provider has had a chance to review them.  We understand that in some cases there may be results that are confusing or concerning to you. Not all laboratory results come back in the same time frame and the provider may be waiting for multiple results in order to interpret others.  Please give us  48 hours in order for your provider to thoroughly review all the results before contacting the office for clarification of your results.   Thank you for trusting me with your gastrointestinal care!   Ellouise Console, PA-C _______________________________________________________  If your blood pressure at your visit was 140/90 or greater, please contact your primary care physician to follow up on this.  _______________________________________________________  If you are age 61 or older, your body mass index should be between 23-30. Your Body mass index is 23.74 kg/m. If this is out of the aforementioned range listed, please consider follow up with your Primary Care Provider.  If you are age 52 or younger, your body mass index should be between 19-25. Your Body mass index is 23.74 kg/m. If this is out of the aformentioned range listed, please consider follow up with your Primary Care Provider.   ________________________________________________________  The [place] [facility] providers would like to encourage you to use MYCHART to communicate with providers for non-urgent requests or questions.  Due to long hold times on the telephone, sending your provider a message by [facility] may be a faster and more efficient way to get a response.   Please allow 48 business hours for a response.  Please remember that this is for non-urgent requests.  _______________________________________________________

## 2024-05-08 ENCOUNTER — Other Ambulatory Visit (HOSPITAL_COMMUNITY): Payer: Self-pay

## 2024-05-08 ENCOUNTER — Ambulatory Visit: Payer: Self-pay | Admitting: Physician Assistant

## 2024-05-08 ENCOUNTER — Other Ambulatory Visit (INDEPENDENT_AMBULATORY_CARE_PROVIDER_SITE_OTHER): Payer: PRIVATE HEALTH INSURANCE

## 2024-05-08 DIAGNOSIS — K5100 Ulcerative (chronic) pancolitis without complications: Secondary | ICD-10-CM

## 2024-05-08 LAB — CBC WITH DIFFERENTIAL/PLATELET
Basophils Absolute: 0 K/uL (ref 0.0–0.1)
Basophils Relative: 0.6 % (ref 0.0–3.0)
Eosinophils Absolute: 0.3 K/uL (ref 0.0–0.7)
Eosinophils Relative: 5.1 % — ABNORMAL HIGH (ref 0.0–5.0)
HCT: 42.3 % (ref 36.0–46.0)
Hemoglobin: 14 g/dL (ref 12.0–15.0)
Lymphocytes Relative: 24.8 % (ref 12.0–46.0)
Lymphs Abs: 1.4 K/uL (ref 0.7–4.0)
MCHC: 33 g/dL (ref 30.0–36.0)
MCV: 96.2 fl (ref 78.0–100.0)
Monocytes Absolute: 0.4 K/uL (ref 0.1–1.0)
Monocytes Relative: 7.1 % (ref 3.0–12.0)
Neutro Abs: 3.5 K/uL (ref 1.4–7.7)
Neutrophils Relative %: 62.4 % (ref 43.0–77.0)
Platelets: 252 K/uL (ref 150.0–400.0)
RBC: 4.4 Mil/uL (ref 3.87–5.11)
RDW: 13.3 % (ref 11.5–15.5)
WBC: 5.6 K/uL (ref 4.0–10.5)

## 2024-05-08 LAB — COMPREHENSIVE METABOLIC PANEL WITH GFR
ALT: 19 U/L (ref 0–35)
AST: 25 U/L (ref 0–37)
Albumin: 4.6 g/dL (ref 3.5–5.2)
Alkaline Phosphatase: 63 U/L (ref 39–117)
BUN: 19 mg/dL (ref 6–23)
CO2: 30 meq/L (ref 19–32)
Calcium: 10 mg/dL (ref 8.4–10.5)
Chloride: 105 meq/L (ref 96–112)
Creatinine, Ser: 0.84 mg/dL (ref 0.40–1.20)
GFR: 81.67 mL/min (ref >60.00)
Glucose, Bld: 86 mg/dL (ref 70–99)
Potassium: 4.8 meq/L (ref 3.5–5.1)
Sodium: 142 meq/L (ref 135–145)
Total Bilirubin: 0.4 mg/dL (ref 0.2–1.2)
Total Protein: 7.7 g/dL (ref 6.0–8.3)

## 2024-05-08 LAB — C-REACTIVE PROTEIN: CRP: <0.5 mg/dL (ref 0.5–20.0)

## 2024-05-11 LAB — QUANTIFERON-TB GOLD PLUS
Mitogen-NIL: 7.47 [IU]/mL
NIL: 0.01 [IU]/mL
QuantiFERON-TB Gold Plus: NEGATIVE
TB1-NIL: 0 [IU]/mL
TB2-NIL: 0.01 [IU]/mL

## 2024-05-11 LAB — CALPROTECTIN, FECAL: Calprotectin, Fecal: 74 ug/g (ref 0–120)

## 2024-05-13 ENCOUNTER — Telehealth: Payer: Self-pay

## 2024-05-13 ENCOUNTER — Other Ambulatory Visit (HOSPITAL_COMMUNITY): Payer: Self-pay

## 2024-05-13 NOTE — Telephone Encounter (Signed)
 Pt.notified

## 2024-05-13 NOTE — Telephone Encounter (Signed)
 Pharmacy Patient Advocate Encounter   Received notification from Pt Calls Messages that prior authorization for Entyvio  Pen 108MG /0.68ML auto-injectors is required/requested.   Insurance verification completed.   The patient is insured through [employer].   Prior Authorization for Entyvio  Pen 108MG /0.68ML auto-injectors has been APPROVED from 05-13-2024 to 05-13-2025  Must fill at [facility]  PA #/Case ID/Reference #: [id number]

## 2024-06-07 ENCOUNTER — Ambulatory Visit: Admitting: Podiatry

## 2024-06-19 ENCOUNTER — Ambulatory Visit: Admitting: Podiatry

## 2024-06-19 ENCOUNTER — Encounter: Payer: Self-pay | Admitting: Podiatry

## 2024-06-19 VITALS — Ht 63.0 in | Wt 134.0 lb

## 2024-06-19 DIAGNOSIS — L600 Ingrowing nail: Secondary | ICD-10-CM

## 2024-06-19 DIAGNOSIS — B351 Tinea unguium: Secondary | ICD-10-CM

## 2024-06-19 NOTE — Progress Notes (Signed)
 Subjective:   Patient ID: Anna Berger, female   DOB: 49 y.o.   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  Do not recommend any other treatments currently except smoothing the nailbeds and wearing polish and I did explain that eventually permanent nail removal may be necessary and I explained to her procedures and risk of doing this
